# Patient Record
Sex: Female | Born: 1955 | Marital: Single
Health system: Southern US, Community
[De-identification: ages and names within clinical notes are randomized; demographics above are authoritative.]

---

## 2014-01-15 ENCOUNTER — Ambulatory Visit
Admit: 2014-01-15 | Disposition: A | Discharge status: Home / Self Care | Payer: Self-pay | Attending: Nurse Practitioner | Admitting: Nurse Practitioner

## 2014-04-14 ENCOUNTER — Inpatient Hospital Stay: Payer: Self-pay | Admitting: Family Medicine

## 2014-04-14 LAB — URINALYSIS, COMPLETE
BILIRUBIN, UR: NEGATIVE
GLUCOSE, UR: NEGATIVE mg/dL (ref 0–75)
KETONE: NEGATIVE
Nitrite: NEGATIVE
Ph: 6 (ref 4.5–8.0)
Protein: NEGATIVE
RBC,UR: 3 /HPF (ref 0–5)
Specific Gravity: 1.013 (ref 1.003–1.030)
WBC UR: 12 /HPF (ref 0–5)

## 2014-04-14 LAB — APTT
ACTIVATED PTT: 34.9 s (ref 23.6–35.9)
ACTIVATED PTT: 68.2 s — AB (ref 23.6–35.9)

## 2014-04-14 LAB — CBC WITH DIFFERENTIAL/PLATELET
BASOS ABS: 0 10*3/uL (ref 0.0–0.1)
Basophil %: 0.1 %
Eosinophil #: 0 10*3/uL (ref 0.0–0.7)
Eosinophil %: 0 %
HCT: 31.9 % — ABNORMAL LOW (ref 35.0–47.0)
HGB: 10 g/dL — ABNORMAL LOW (ref 12.0–16.0)
Lymphocyte #: 0.5 10*3/uL — ABNORMAL LOW (ref 1.0–3.6)
Lymphocyte %: 3.1 %
MCH: 25.6 pg — ABNORMAL LOW (ref 26.0–34.0)
MCHC: 31.3 g/dL — ABNORMAL LOW (ref 32.0–36.0)
MCV: 82 fL (ref 80–100)
MONO ABS: 0.6 x10 3/mm (ref 0.2–0.9)
Monocyte %: 3.9 %
Neutrophil #: 13.4 10*3/uL — ABNORMAL HIGH (ref 1.4–6.5)
Neutrophil %: 92.9 %
PLATELETS: 527 10*3/uL — AB (ref 150–440)
RBC: 3.89 10*6/uL (ref 3.80–5.20)
RDW: 20.9 % — ABNORMAL HIGH (ref 11.5–14.5)
WBC: 14.5 10*3/uL — ABNORMAL HIGH (ref 3.6–11.0)

## 2014-04-14 LAB — PROTIME-INR
INR: 1
Prothrombin Time: 13.5 secs (ref 11.5–14.7)

## 2014-04-14 LAB — BASIC METABOLIC PANEL
Anion Gap: 8 (ref 7–16)
BUN: 20 mg/dL — AB (ref 7–18)
CREATININE: 0.8 mg/dL (ref 0.60–1.30)
Calcium, Total: 8.3 mg/dL — ABNORMAL LOW (ref 8.5–10.1)
Chloride: 93 mmol/L — ABNORMAL LOW (ref 98–107)
Co2: 27 mmol/L (ref 21–32)
EGFR (African American): >60
EGFR (Non-African Amer.): >60
GLUCOSE: 88 mg/dL (ref 65–99)
Osmolality: 259 (ref 275–301)
Potassium: 3.9 mmol/L (ref 3.5–5.1)
Sodium: 128 mmol/L — ABNORMAL LOW (ref 136–145)

## 2014-04-15 DIAGNOSIS — I059 Rheumatic mitral valve disease, unspecified: Secondary | ICD-10-CM

## 2014-04-15 LAB — BASIC METABOLIC PANEL
Anion Gap: 5 — ABNORMAL LOW (ref 7–16)
BUN: 17 mg/dL (ref 7–18)
CALCIUM: 7.6 mg/dL — AB (ref 8.5–10.1)
Chloride: 102 mmol/L (ref 98–107)
Co2: 26 mmol/L (ref 21–32)
Creatinine: 0.69 mg/dL (ref 0.60–1.30)
EGFR (African American): >60
EGFR (Non-African Amer.): >60
Glucose: 67 mg/dL (ref 65–99)
Osmolality: 266 (ref 275–301)
Potassium: 3.5 mmol/L (ref 3.5–5.1)
Sodium: 133 mmol/L — ABNORMAL LOW (ref 136–145)

## 2014-04-15 LAB — APTT
ACTIVATED PTT: 56.3 s — AB (ref 23.6–35.9)
Activated PTT: 36.1 secs — ABNORMAL HIGH (ref 23.6–35.9)
Activated PTT: 51.9 secs — ABNORMAL HIGH (ref 23.6–35.9)

## 2014-04-15 LAB — CBC WITH DIFFERENTIAL/PLATELET
BASOS ABS: 0 10*3/uL (ref 0.0–0.1)
Basophil %: 0.2 %
Eosinophil #: 0 10*3/uL (ref 0.0–0.7)
Eosinophil %: 0 %
HCT: 28.1 % — ABNORMAL LOW (ref 35.0–47.0)
HGB: 8.9 g/dL — AB (ref 12.0–16.0)
LYMPHS ABS: 1.1 10*3/uL (ref 1.0–3.6)
LYMPHS PCT: 10.4 %
MCH: 26.1 pg (ref 26.0–34.0)
MCHC: 31.6 g/dL — AB (ref 32.0–36.0)
MCV: 83 fL (ref 80–100)
MONOS PCT: 6.2 %
Monocyte #: 0.6 x10 3/mm (ref 0.2–0.9)
NEUTROS ABS: 8.6 10*3/uL — AB (ref 1.4–6.5)
Neutrophil %: 83.2 %
PLATELETS: 465 10*3/uL — AB (ref 150–440)
RBC: 3.41 10*6/uL — AB (ref 3.80–5.20)
RDW: 20.7 % — AB (ref 11.5–14.5)
WBC: 10.3 10*3/uL (ref 3.6–11.0)

## 2014-04-16 LAB — APTT
ACTIVATED PTT: 59 s — AB (ref 23.6–35.9)
Activated PTT: 80.7 secs — ABNORMAL HIGH (ref 23.6–35.9)

## 2014-04-16 LAB — CLOSTRIDIUM DIFFICILE(ARMC)

## 2014-04-17 ENCOUNTER — Ambulatory Visit: Payer: Self-pay | Admitting: Internal Medicine

## 2014-04-17 ENCOUNTER — Ambulatory Visit
Admit: 2014-04-17 | Disposition: A | Discharge status: Home / Self Care | Payer: Self-pay | Attending: Nurse Practitioner | Admitting: Nurse Practitioner

## 2014-04-17 LAB — CBC WITH DIFFERENTIAL/PLATELET
BASOS PCT: 0.5 %
Basophil #: 0 10*3/uL (ref 0.0–0.1)
EOS PCT: 0 %
Eosinophil #: 0 10*3/uL (ref 0.0–0.7)
HCT: 25.5 % — AB (ref 35.0–47.0)
HGB: 8.1 g/dL — AB (ref 12.0–16.0)
Lymphocyte #: 1 10*3/uL (ref 1.0–3.6)
Lymphocyte %: 11.1 %
MCH: 26.3 pg (ref 26.0–34.0)
MCHC: 31.9 g/dL — ABNORMAL LOW (ref 32.0–36.0)
MCV: 82 fL (ref 80–100)
MONO ABS: 0.6 x10 3/mm (ref 0.2–0.9)
MONOS PCT: 7 %
Neutrophil #: 7.5 10*3/uL — ABNORMAL HIGH (ref 1.4–6.5)
Neutrophil %: 81.4 %
Platelet: 448 10*3/uL — ABNORMAL HIGH (ref 150–440)
RBC: 3.09 10*6/uL — ABNORMAL LOW (ref 3.80–5.20)
RDW: 21 % — ABNORMAL HIGH (ref 11.5–14.5)
WBC: 9.3 10*3/uL (ref 3.6–11.0)

## 2014-04-17 LAB — BASIC METABOLIC PANEL
Anion Gap: 6 — ABNORMAL LOW (ref 7–16)
BUN: 13 mg/dL (ref 7–18)
CHLORIDE: 107 mmol/L (ref 98–107)
CO2: 24 mmol/L (ref 21–32)
Calcium, Total: 7.5 mg/dL — ABNORMAL LOW (ref 8.5–10.1)
Creatinine: 0.79 mg/dL (ref 0.60–1.30)
EGFR (African American): >60
Glucose: 82 mg/dL (ref 65–99)
OSMOLALITY: 273 (ref 275–301)
Potassium: 3.2 mmol/L — ABNORMAL LOW (ref 3.5–5.1)
Sodium: 137 mmol/L (ref 136–145)

## 2014-04-17 LAB — APTT
ACTIVATED PTT: 86 s — AB (ref 23.6–35.9)
Activated PTT: >160 secs (ref 23.6–35.9)

## 2014-04-18 LAB — BASIC METABOLIC PANEL
ANION GAP: 7 (ref 7–16)
BUN: 11 mg/dL (ref 7–18)
CALCIUM: 7.5 mg/dL — AB (ref 8.5–10.1)
CREATININE: 0.8 mg/dL (ref 0.60–1.30)
Chloride: 108 mmol/L — ABNORMAL HIGH (ref 98–107)
Co2: 23 mmol/L (ref 21–32)
EGFR (African American): >60
EGFR (Non-African Amer.): >60
GLUCOSE: 81 mg/dL (ref 65–99)
OSMOLALITY: 274 (ref 275–301)
Potassium: 3.9 mmol/L (ref 3.5–5.1)
SODIUM: 138 mmol/L (ref 136–145)

## 2014-04-18 LAB — URINE CULTURE

## 2014-04-20 LAB — HEMOGLOBIN: HGB: 8.3 g/dL — AB (ref 12.0–16.0)

## 2014-04-21 LAB — BASIC METABOLIC PANEL
Anion Gap: 8 (ref 7–16)
BUN: 15 mg/dL (ref 7–18)
CREATININE: 0.51 mg/dL — AB (ref 0.60–1.30)
Calcium, Total: 7.6 mg/dL — ABNORMAL LOW (ref 8.5–10.1)
Chloride: 109 mmol/L — ABNORMAL HIGH (ref 98–107)
Co2: 21 mmol/L (ref 21–32)
EGFR (African American): >60
Glucose: 84 mg/dL (ref 65–99)
Osmolality: 276 (ref 275–301)
Potassium: 4.2 mmol/L (ref 3.5–5.1)
Sodium: 138 mmol/L (ref 136–145)

## 2014-04-21 LAB — CBC WITH DIFFERENTIAL/PLATELET
BASOS ABS: 0 10*3/uL (ref 0.0–0.1)
BASOS PCT: 0.1 %
EOS ABS: 0 10*3/uL (ref 0.0–0.7)
Eosinophil %: 0.1 %
HCT: 21.2 % — AB (ref 35.0–47.0)
HGB: 6.6 g/dL — AB (ref 12.0–16.0)
LYMPHS ABS: 1.2 10*3/uL (ref 1.0–3.6)
Lymphocyte %: 10.2 %
MCH: 26.1 pg (ref 26.0–34.0)
MCHC: 31 g/dL — ABNORMAL LOW (ref 32.0–36.0)
MCV: 84 fL (ref 80–100)
MONOS PCT: 4.7 %
Monocyte #: 0.5 x10 3/mm (ref 0.2–0.9)
Neutrophil #: 9.6 10*3/uL — ABNORMAL HIGH (ref 1.4–6.5)
Neutrophil %: 84.9 %
Platelet: 426 10*3/uL (ref 150–440)
RBC: 2.52 10*6/uL — AB (ref 3.80–5.20)
RDW: 21.1 % — ABNORMAL HIGH (ref 11.5–14.5)
WBC: 11.3 10*3/uL — AB (ref 3.6–11.0)

## 2014-04-21 LAB — HEMOGLOBIN: HGB: 9.7 g/dL — AB (ref 12.0–16.0)

## 2014-04-22 LAB — HEMOGLOBIN
HGB: 8.5 g/dL — AB (ref 12.0–16.0)
HGB: 9.5 g/dL — AB (ref 12.0–16.0)

## 2014-04-22 LAB — ALBUMIN: ALBUMIN: 1 g/dL — AB (ref 3.4–5.0)

## 2014-04-23 LAB — HEMOGLOBIN: HGB: 7.9 g/dL — ABNORMAL LOW (ref 12.0–16.0)

## 2014-04-24 LAB — HEMOGLOBIN: HGB: 7.4 g/dL — ABNORMAL LOW (ref 12.0–16.0)

## 2014-05-17 ENCOUNTER — Ambulatory Visit: Payer: Self-pay | Admitting: Internal Medicine

## 2014-06-17 DEATH — deceased

## 2015-03-01 IMAGING — CR DG FEMUR 2V*R*
1 series · 4 of 4 positions shown · non-contrast
Comparison: None.

CLINICAL DATA: Trauma and pain.

EXAM:
RIGHT KNEE - COMPLETE 4+ VIEW; RIGHT FEMUR - 2 VIEW

[Series 1: t femur proximal ap right · 0.14mm/px · 4 of 4 slices shown]
[im 1/4]
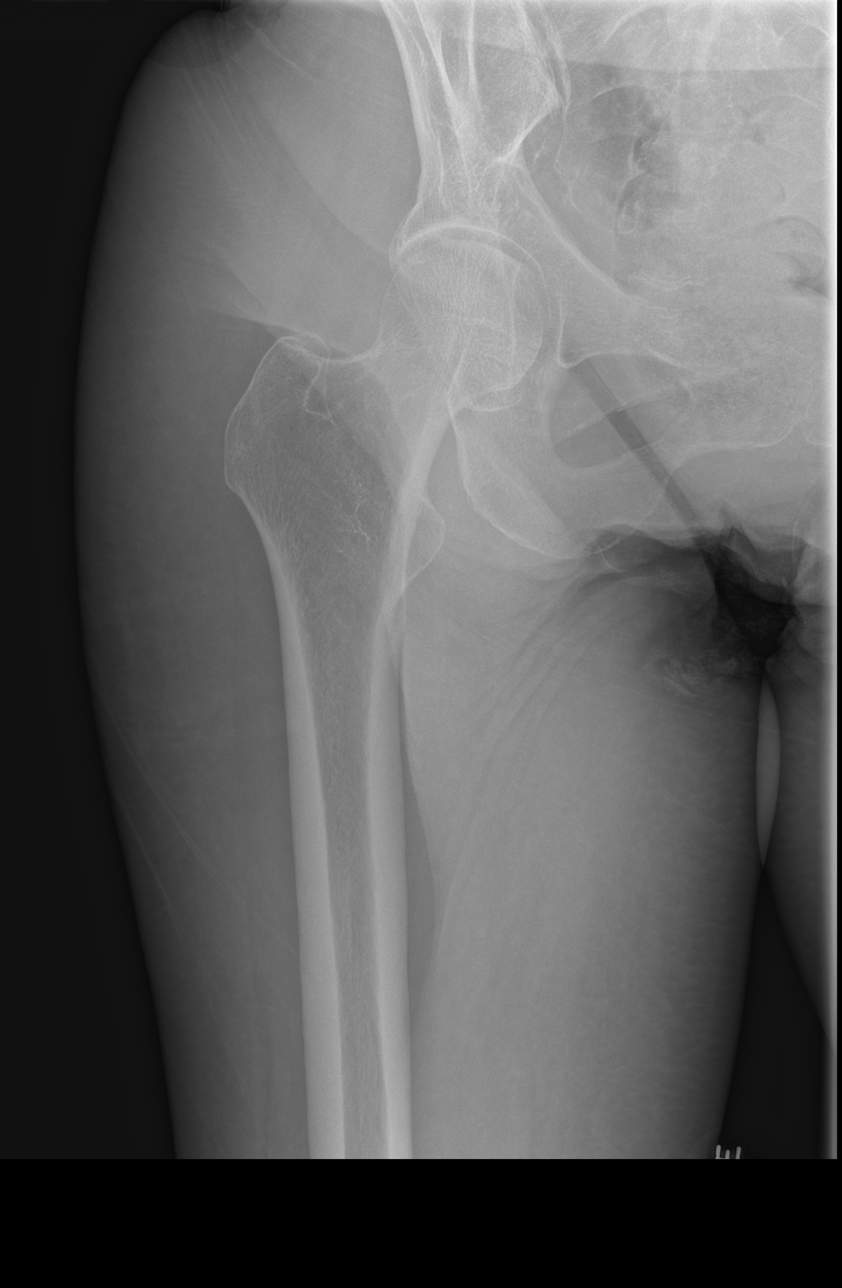
[im 2/4]
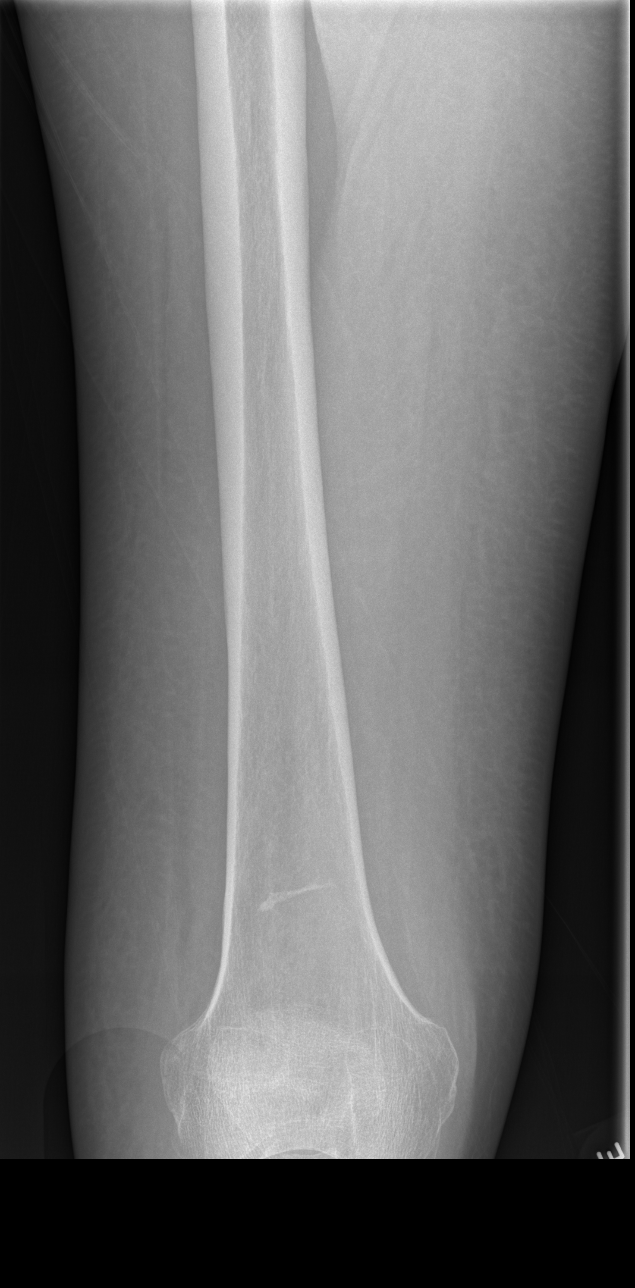
[im 3/4]
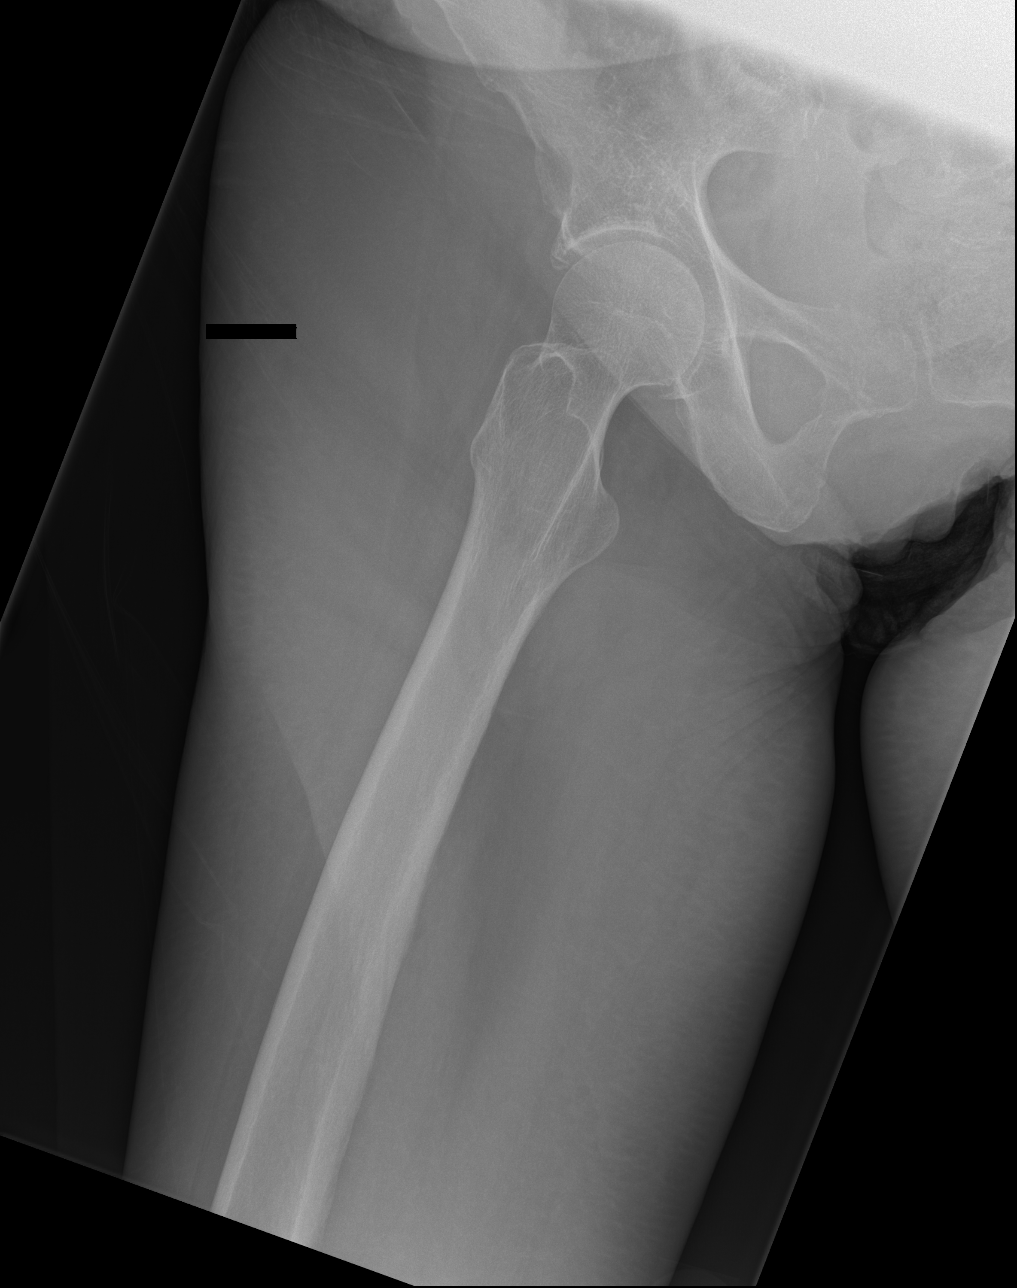
[im 4/4]
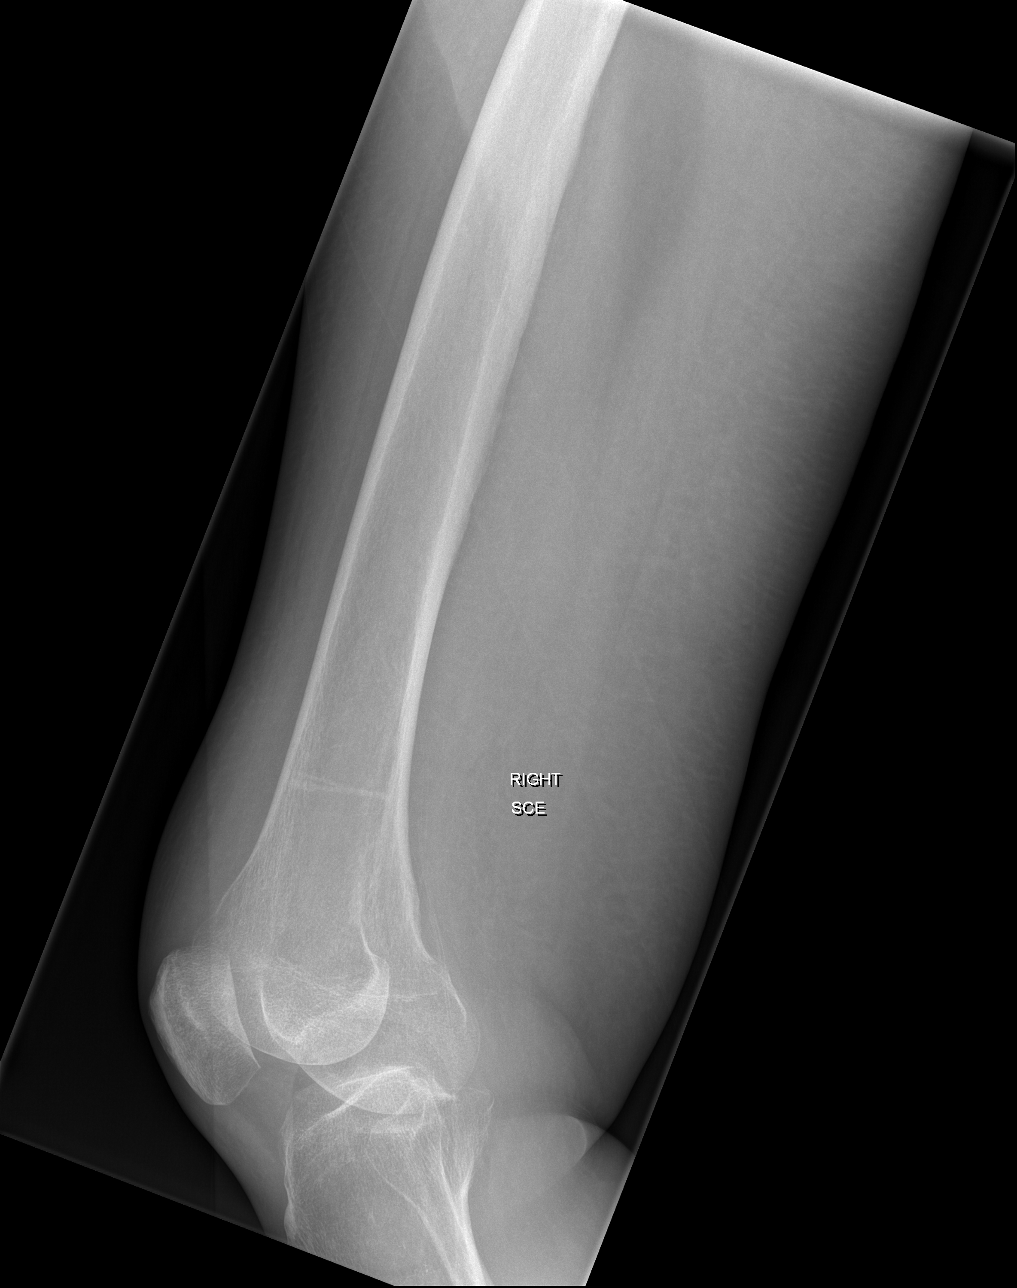

[4 of 4 positions shown; findings below may reference images not displayed]

FINDINGS: Four views of the right knee demonstrate mild osteopenia. Mild
patellofemoral and medial compartment joint space narrowing. No
acute fracture or dislocation. No joint effusion.

Four views of the right femur.  No acute fracture or dislocation.
IMPRESSION: No acute findings about the right femur or knee.

Mild 2 compartment knee osteoarthritis.

## 2015-03-01 IMAGING — CT CT ANGIO CHEST-ABD-PELV
2 of 6 series · 12 of 36 positions shown, 14 images · IV contrast (APPLIED)
Comparison: Right lower extremity ultrasound 1303 hr the same day.

CLINICAL DATA: 58-year-old female with right lower extremity
weakness pain and swelling. Shortness of breath, hypotension.
Dissection or pulmonary embolus. Initial encounter. History of
metastatic colon cancer. Right lower extremity DVT detected on
ultrasound today.

EXAM:
CT ANGIOGRAPHY CHEST, ABDOMEN AND PELVIS
TECHNIQUE: Multidetector CT imaging through the chest, abdomen and pelvis was
performed using the standard protocol during bolus administration of
intravenous contrast. Multiplanar reconstructed images and MIPs were
obtained and reviewed to evaluate the vascular anatomy.
CONTRAST:  100 mL Isovue 370.

[Series 6: arterial · axial · arterial · 0.72mm/px · z∈[-848,-284]mm · 11 of 326 slices shown, 13 images]
[im 29/326  mediastinal]
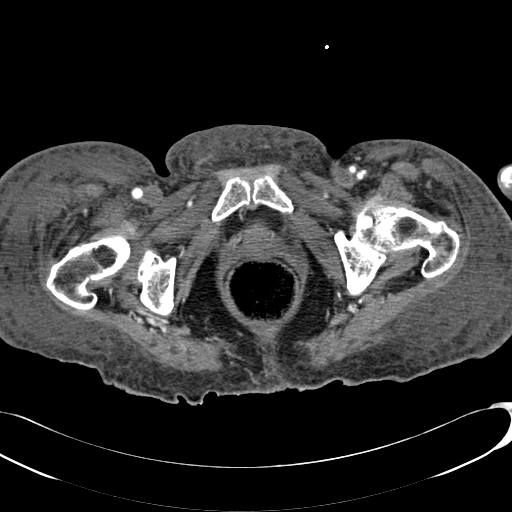
[im 29/326  bone]
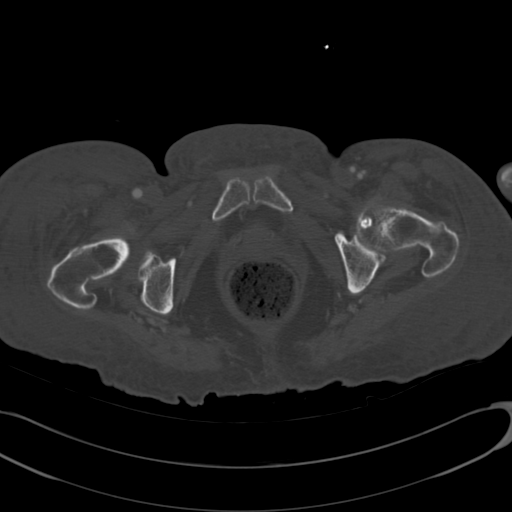
[im 71/326  mediastinal]
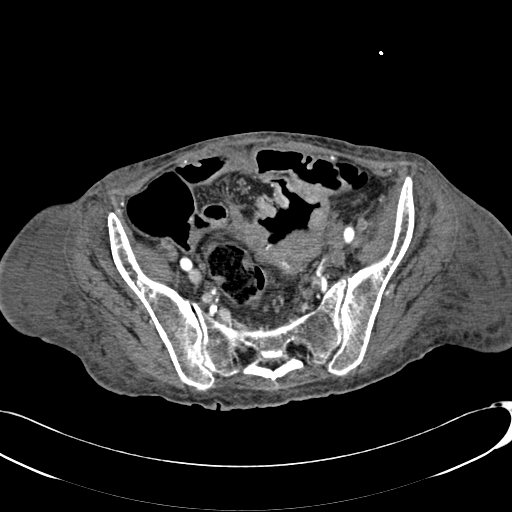
[im 99/326  mediastinal]
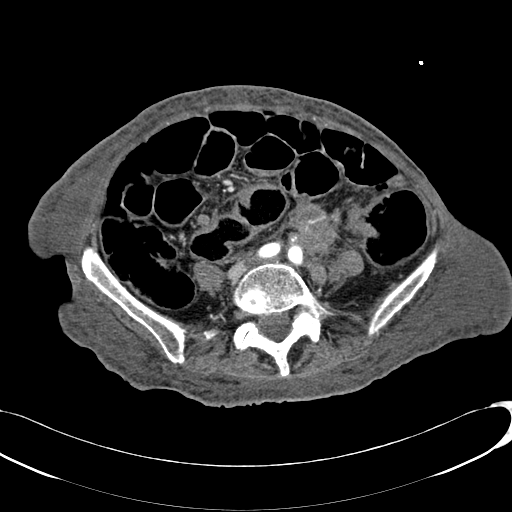
[im 142/326  mediastinal]
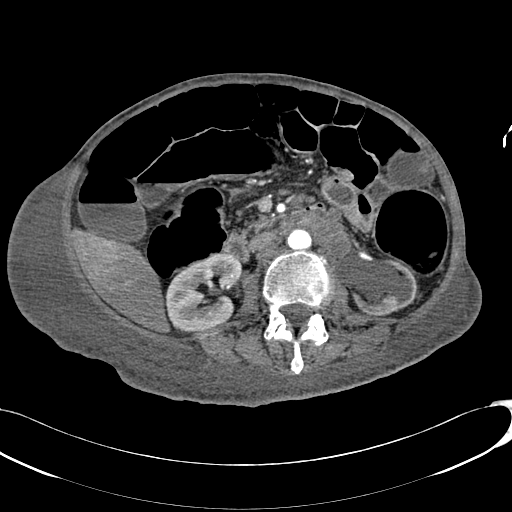
[im 184/326  mediastinal]
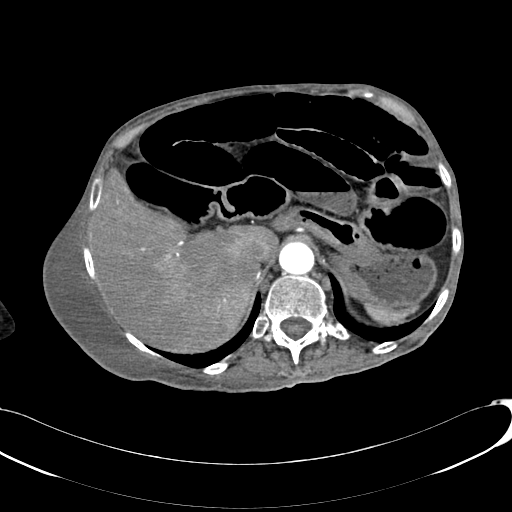
[im 227/326  mediastinal]
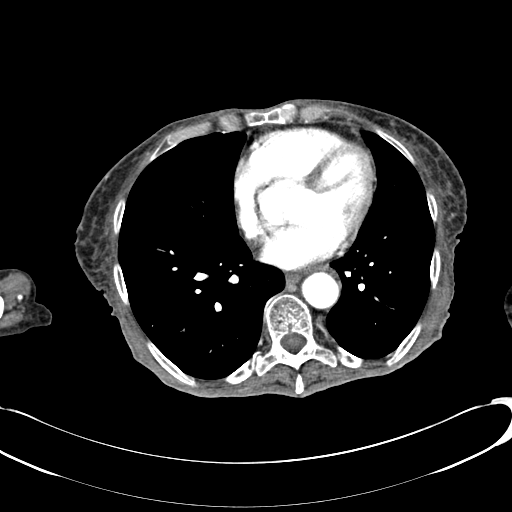
[im 255/326  mediastinal]
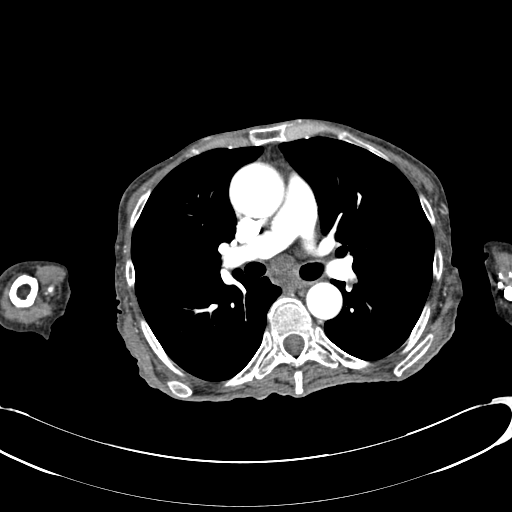
[im 269/326  lung]
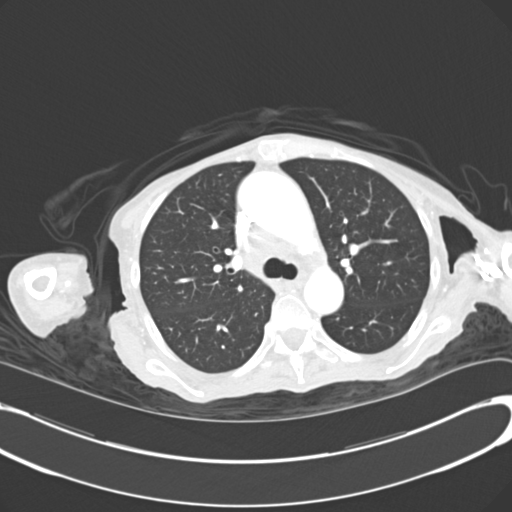
[im 283/326  lung]
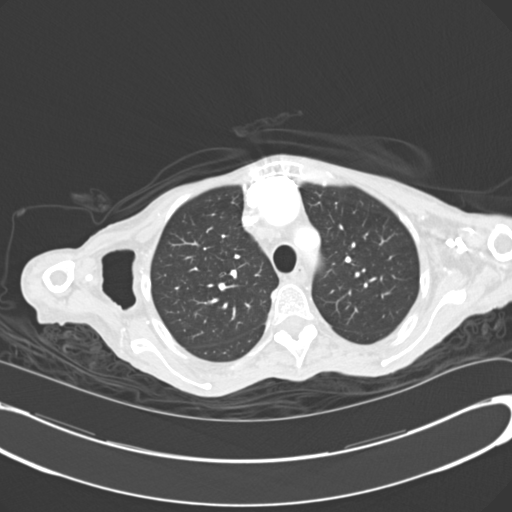
[im 297/326  mediastinal]
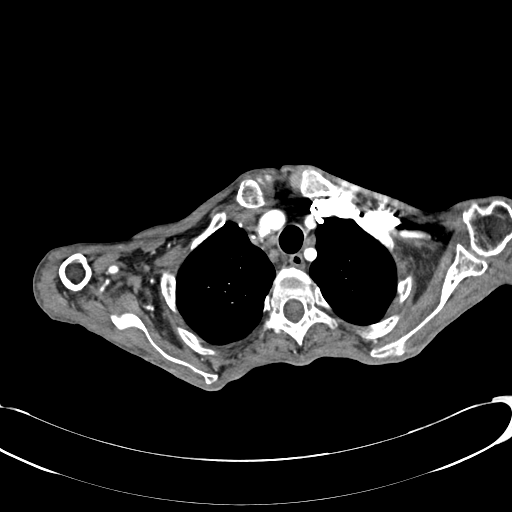
[im 297/326  lung]
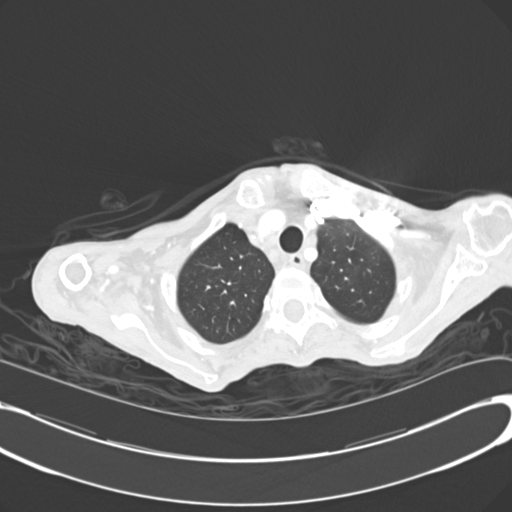
[im 311/326  lung]
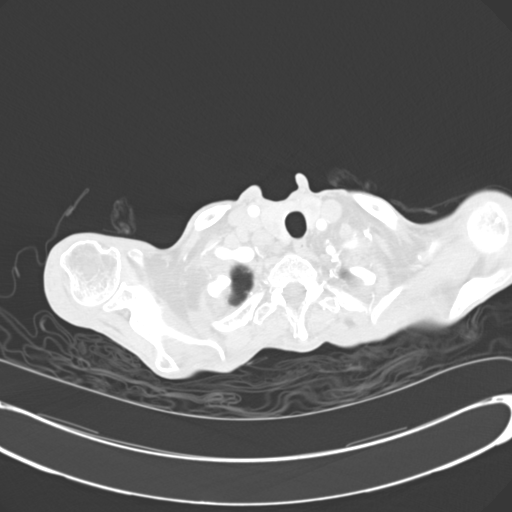

[Series 8: cor arterial mpr · coronal · arterial · 0.68mm/px · 1 of 119 slices shown]
[im 60/119  mediastinal]
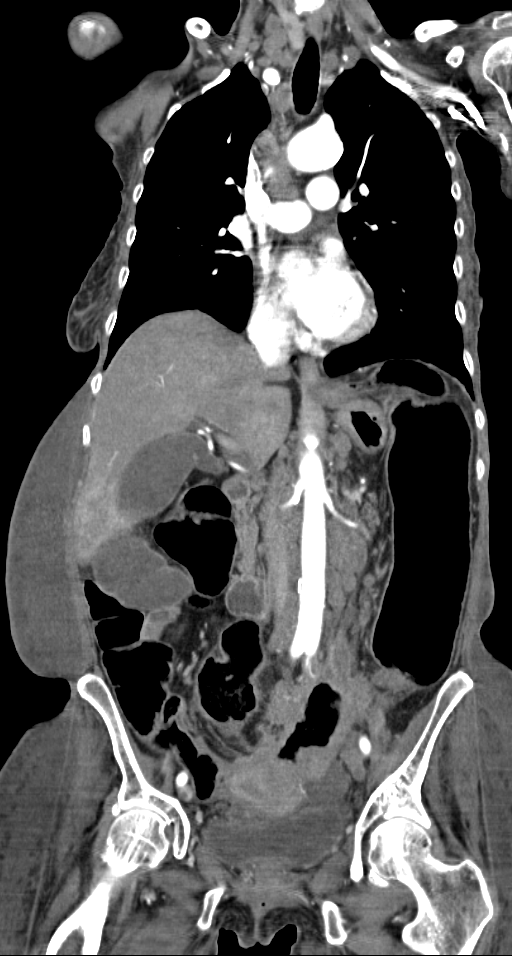

[12 of 36 positions shown; findings below may reference images not displayed]

FINDINGS: CTA CHEST FINDINGS

Major airways are patent. Occasional mild linear scarring or
atelectasis in both lungs. No pneumothorax, pleural effusion or
consolidation. There is a small cluster of 3 mm pulmonary nodules in
the periphery of the right midlung (series 5 images 20 and 21).
Similar cluster seen in the right upper lobe on image 12. No
pulmonary nodule identified on the left.

No destructive osseous lesion identified in the chest.

Multiple bilateral thyroid nodules, the largest about 12 mm,
significance doubtful in this clinical setting.

Bulky right peritracheal and right thoracic inlet lymphadenopathy,
individual nodes or nodal conglomeration measuring up to 17 mm short
axis. Bulky right anterior carina nodal conglomeration encompassing
26 x 31 mm. Confluent nodal disease continues into the subcarinal
station. No hilar lymphadenopathy. Smaller left thoracic inlet/
prevascular nodes measuring up to 11 mm short axis individually. No
pericardial effusion. No axillary lymphadenopathy.

VASCULAR FINDINGS:

Mild atherosclerosis of the aorta and proximal great vessels which
remain patent. No thoracic aortic dissection. Ascending aorta
fusiform enlargement up to 40 mm diameter.

Positive right lower lobe pulmonary emboli (series 6, image 90).
More distal occlusive emboli in the posterior basal segment of the
right lower lobe. Right lateral basal segment involvement also
suspected. Right upper lobe pulmonary arteries are spared. No saddle
or central embolus. Small volume clot at the left lower lobe
segmental pulmonary artery branching (image 72). Left upper lobe
pulmonary arteries are spared.

RV/LV ratio 0.95. Mild straightening of the interventricular septum
(series 6, image 107).

Review of the MIP images confirms the above findings.

CTA ABDOMEN AND PELVIS FINDINGS

No destructive osseous lesion identified in the lumbar spine or
pelvis.

Moderate to severe body wall edema greater on the right. Diffuse
subcutaneous stranding. This has an appearance suggesting anasarca,
but is asymmetrically involving the right abdominal wall and flank
to a greater degree.

No pelvic free fluid. Stool in the rectum. In the sigmoid colon
there is a thick walled fungating soft tissue mass nearly
obliterating the lumen of the proximal sigmoid. This lesion
encompasses 53 by 139 x 47 mm (AP by transverse by CC). See coronal
image 56 and series 6, image 264. The lesion is inseparable from the
bladder fundus. The lesion is inseparable from distal small bowel
loops, and more proximal small bowel loops in the abdomen are mildly
to moderately dilated up to the 35 mm diameter. There is low-density
stool throughout the more proximal colon which also is mildly
dilated. There is bulky retroperitoneal lymphadenopathy extending to
the SMA origin. There is small bowel mesenteric adenopathy with
nodes individually measuring up to 23 mm short axis. The left ureter
and kidney are obstructed by the retroperitoneal and sigmoid
disease. The left renal cortex is severely thinned.

The stomach and duodenum are decompressed. There is heterogeneous
enhancement of the liver, with geographic hypoenhancement. There are
small superimposed more discrete low-density lesions in the liver
which are small, the largest is 2 cm in the inferior right hepatic
lobe (series 6, image 167).

Negative gallbladder, spleen, pancreas (atrophied), adrenal glands,
right kidney, and right ureter.

VASCULAR FINDINGS:

The abdominal aorta and its major branches are patent. Major pelvic
arterial structures are patent with mild atherosclerosis. There does
appear to be preserved enhancement of the right pelvic veins and
IVC. The IVC is flattened in the upper abdomen, perhaps in part due
to the retroperitoneal metastases. No abdominal aortic dissection.

Review of the MIP images confirms the above findings.
IMPRESSION: 1. Positive for acute pulmonary embolus. Positive for acute PE with
CTevidence of right heart strain (RV/LV Ratio = 0.95) consistent
with at least submassive (intermediate risk) PE. The presence of
right heart strain has been associated with an increased risk of
morbidity and mortality. Critical Value/emergent results were called
by telephone at the time of interpretation on 04/14/2014 at [DATE]
to Dr. QUIRIJN AMAZIGH , who verbally acknowledged these results.
2. Bulky and fungating sigmoid colon tumor with secondary
involvement of distal small bowel loops, the left ureter, and the
dome of the bladder.
3. Subsequent small bowel obstruction, appears moderate. Subsequent
severe and subacute to chronic appearing left renal an ureter
obstruction (with left renal atrophy).
4. Bulky mediastinal, retroperitoneal, and thoracic inlet metastatic
nodal disease.
5. Abnormal enhancement of the liver, might in part reflect
congestive hepatopathy. Several 2 cm or smaller hypodense liver
lesions, indeterminate for liver metastases.
6. No abdominal aortic dissection or aneurysm. There is fusiform
aneurysmal enlargement of the ascending aorta up to 40 mm diameter.

## 2015-03-10 NOTE — Discharge Summary (Signed)
PATIENT NAME:  French, [name] MR#:  [id number] DATE OF BIRTH:  December 02, 1955  DATE OF ADMISSION:  04/14/2014 DATE OF DISCHARGE:  04/20/2014  ADMITTING DIAGNOSIS: Right leg pain.   DISCHARGE DIAGNOSES: 1.  Right leg pain due to deep vein thrombosis, as well as pulmonary embolism.  2.  Metastatic colon cancer with widespread metastatic disease, inoperable and prognosis very poor.  3.  Anemia. 4.  Hypotension with chronic borderline hypotension possibly related to pulmonary embolism but now blood pressure stable. 5.  Possible urinary tract infection on presentation status post treatment. 6.  Diarrhea with negative Clostridium difficile likely due to colon cancer.  CONSULTANTS:  Dr. Meredeth IdeFleming. Palliative care Borders, Ashley French.  PERTINENT LABORATORY EVALUATIONS:  Admitting glucose 88, BUN 20, creatinine 0.80. Sodium 128, potassium was 3.9, chloride 93, CO2 of 27, calcium 8.3. WBC 14.5, hemoglobin 10, platelet count was 527. INR 1.0. Urinalysis was nitrites negative.  C. diff was negative.  Echocardiogram of the heart showed EF 45% to 50%.  CT per PE protocol showed positive for acute PE with right heart strain.  Bulky mediastinal, retroperitoneal and thoracic inlet metastatic nodal disease.  HOSPITAL COURSE: Please refer to H and P done by the admitting physician. The patient is a 59 year old African American female with past medical history of metastatic colon cancer diagnosed about 3 months ago at [facility] who was discharged home with hospice because her metastatic disease is inoperable, and was told that there was not much else treatment available. The patient presented to our hospital with lower extremity swelling. She was evaluated in the ED and was diagnosed with a DVT. Also she underwent a CT per PE protocol which was positive for PE. The patient was started initially on heparin. She was thought to have right ventricular stain as well on a CT scan. She was seen in consultation by  pulmonary who agreed with the current plan. The patient was switched over subsequently to Xarelto and is currently being treated with Xarelto.  Currently, she is stable, not short of breath. Blood pressure is stable. Her prognosis is poor, and a very high, short-term mortality. At this time, she is stable for discharge.   DISCHARGE MEDICATIONS: Lactulose 30 mg daily, oxycodone 5 mg 1 tab q. 2 to 3 hours as needed, morphine 30 mg tablet 1 tab p.o. b.i.d., haloperidol 0.5 four times a day as needed, nystatin 5 mL 4 times a day swish and swallow, senna 2 tabs at bedtime, dexamethasone 4 mg 1 tab p.o. b.i.d., Xarelto 15 mg 1 tab p.o. b.i.d. for 20 days then Xarelto 20 mg 1 tab p.o. daily after the 15 mg.   ACTIVITY: As tolerated.   FOLLOWUP: With primary MD in 1 to 2 weeks. Follow with primary oncologist in 2 to 4 weeks.  TIME SPENT ON THE DISCHARGE:  35 minutes.   ____________________________ Lacie ScottsShreyang H. Allena KatzPatel, MD shp:ce D: 04/20/2014 14:59:31 ET T: 04/20/2014 15:36:02 ET JOB#: [id number]  cc: Aaminah Forrester H. Allena KatzPatel, MD, <Dictator> Charise CarwinSHREYANG H Oleta Gunnoe MD ELECTRONICALLY SIGNED 04/22/2014 14:34

## 2015-03-10 NOTE — Op Note (Signed)
PATIENT NAME:  French, [name] MR#:  [id number] DATE OF BIRTH:  October 28, 1956  DATE OF PROCEDURE:  04/21/2014  PREOPERATIVE DIAGNOSES:  1.  Pulmonary embolus with gastrointestinal bleed. 2.  Metastatic colon cancer.   POSTOPERATIVE DIAGNOSES: 1.  Pulmonary embolus with gastrointestinal bleed. 2.  Metastatic colon cancer.   PROCEDURES PERFORMED: 1.  Ultrasound guidance for vascular access to right femoral.  2.  Catheter placement into inferior vena cava.  3.  Inferior venacavogram.  4.  Placement of a Bard Denali IVC filter.   SURGEON: Festus BarrenJason Charlisa Cham, MD    ANESTHESIA: Local with sedation.    ESTIMATED BLOOD LOSS: Minimal.   FLUOROSCOPY TIME:  Less than 1 minute.   CONTRAST USED:  Visipaque 15 mL.    INDICATION FOR PROCEDURE:  This is a 59 year old African American female with metastatic colon cancer. She has a recent large pulmonary embolus. She was started on Zaroxolyn, but now has developed GI bleeding and this will have to be stopped. She desired to have an IVC filter placed. We discussed that with her advanced malignancy, it would be reasonable to perform no procedure and have only comfort measures, but she was not prepared for this and desired to have a filter so no further PE would occur.   DESCRIPTION OF PROCEDURE:  The patient was brought to the vascular suite. The skin is sterilely prepped and draped and a sterile surgical field was created. The right femoral vein, after the left femoral vein was seen to be occluded, was accessed under direct ultrasound guidance without difficulty with a Seldinger needle and a J-wire was then placed. After skin nick and dilatation, the delivery sheath was placed into the inferior vena cava and an inferior venacavogram was performed. This demonstrated a patent IVC with the level of the renal veins at L1. There was a small amount of non-occlusive thrombus within the mid inferior vena cava just below the renal veins.  The filter was then deployed into the  inferior vena cava at the level of L2 just below the non-occlusive thrombus as there was not adequately a place to place this more proximally.  The delivery sheath was then removed. Pressure was held. Sterile dressing was placed. The patient tolerated the procedure well and was taken to the recovery room in stable condition.    ____________________________ Ashley NeedyJason S. Katori Wirsing, MD jsd:aw D: 04/21/2014 08:39:11 ET T: 04/21/2014 08:57:17 ET JOB#: [id number]  cc: Ashley NeedyJason S. Channel Papandrea, MD, <Dictator> Ashley NeedyJASON S Riniyah Speich MD ELECTRONICALLY SIGNED 04/28/2014 12:34

## 2015-03-10 NOTE — Discharge Summary (Signed)
Dates of Admission and Diagnosis:  Date of Admission 14-Apr-2014   Date of Discharge 24-Apr-2014   Admitting Diagnosis Pulmonary embolism   Final Diagnosis 1. Bilateral PE and RLE DVT 2. Metastatic colon cancer 3. GI bleed 4. Acute blood loss anemia 5. Severe malnutrition 6. Hypoalbuminemia 7. Deconditioning 8. Depression    Chief Complaint/History of Present Illness CHIEF COMPLAINT: Right leg pain.   HISTORY OF PRESENT ILLNESS: Ashley French is a 58-year-old African American female with past medical history significant for metastatic cancer diagnosed about 3 months ago at [facility], likely colon cancer. Was discharged home with hospice because it was inoperable and she was given an extremely poor prognosis. The patient states she was still walking at home but over the last couple of months noted that she has been having some right thigh and knee pain.  She still was ambulating with rehab. For over the last couple of weeks, her pain and swelling have become much worse to the point that she is not able to get out of bed and her daughter is not able to pick her up, so presented to the ER. She is followed by [facility] according to the patient. The patient was noted to be hypotensive and tachycardic here and her ultrasound showed occlusive right femoropopliteal DVT.  She also complained of some dyspnea going on since around the same time. No chest pain and CT chest was ordered which showed the patient has acute pulmonary embolus.  So she is being admitted for symptomatic DVT and PE.   PAST MEDICAL HISTORY:  Metastatic colon cancer with widespread metastatic disease, inoperable and poor prognosis.   Allergies:  No Known Allergies:     Hepatic:  06-Jun-15 05:38   Albumin, Serum  1.0 (Result(s) reported on 22 Apr 2014 at 12:38PM.)  Routine BB:  05-Jun-15 06:59   ABO Group + Rh Type A Positive  Antibody Screen NEGATIVE (Result(s) reported on 21 Apr 2014 at 08:12AM.)   Crossmatch Unit 1 Transfused  Crossmatch Unit 2 Transfused  Result(s) reported on 22 Apr 2014 at 07:25AM.  Routine Chem:  05-Jun-15 04:10   Glucose, Serum 84  BUN 15  Creatinine (comp)  0.51  Sodium, Serum 138  Potassium, Serum 4.2  Chloride, Serum  109  CO2, Serum 21  Calcium (Total), Serum  7.6  Anion Gap 8  Osmolality (calc) 276  eGFR (African American) >60  eGFR (Non-African American) >60 (eGFR values <60mL/min/1.73 m2 may be an indication of chronic kidney disease (CKD). Calculated eGFR is useful in patients with stable renal function. The eGFR calculation will not be reliable in acutely ill patients when serum creatinine is changing rapidly. It is not useful in  patients on dialysis. The eGFR calculation may not be applicable to patients at the low and high extremes of body sizes, pregnant women, and vegetarians.)  Routine Hem:  05-Jun-15 04:10   Hemoglobin (CBC)  6.6  WBC (CBC)  11.3  RBC (CBC)  2.52  Hematocrit (CBC)  21.2  Platelet Count (CBC) 426  MCV 84  MCH 26.1  MCHC  31.0  RDW  21.1  Neutrophil % 84.9  Lymphocyte % 10.2  Monocyte % 4.7  Eosinophil % 0.1  Basophil % 0.1  Neutrophil #  9.6  Lymphocyte # 1.2  Monocyte # 0.5  Eosinophil # 0.0  Basophil # 0.0 (Result(s) reported on 21 Apr 2014 at 05:44AM.)    21:04   Hemoglobin (CBC)  9.7 (Result(s) reported on 21 Apr 2014 at   09:39PM.)  06-Jun-15 05:38   Hemoglobin (CBC)  8.5 (Result(s) reported on 22 Apr 2014 at 06:14AM.)    13:16   Hemoglobin (CBC)  9.5 (Result(s) reported on 22 Apr 2014 at 01:43PM.)  07-Jun-15 06:07   Hemoglobin (CBC)  7.9 (Result(s) reported on 23 Apr 2014 at 06:50AM.)   Pertinent Past History:  Pertinent Past History Metastatic colon cancer AOCD   Hospital Course:  Hospital Course * Bilateal PE and RLE DVT with RV strain Pt was on Xarelto but had GI bleed and had to be stopped. Now has IVC filter placed. High risk for progression of PE/DVT due to active cancer  * GI  bleed likely from the colon cancer with Acute blood loss anemia over AOCD. Transfused 2 Units PRBC 04/21/2014. GI has seen pt but she is not a candidate for endoscopy. Bleeding has stopped as pt is of Xarelto. She will have likely small amounts of bleeding going on due to her cancer.  * Hypovolemic hypotension due to blood loss- Resolved  * hypokalemia- replaced  * Metastatic colon cancer- On Hospice .prognosis poor . No plans of treatment  * Diarrhea- negative C diff, loperamide PRN.  Very poor prognosis with Extensive colon cancer and bilateral PE.  Pt presently wants to continue to work with PT to get strength back and hopefully go home with hospice. She does want a quality life rather than prolong life with aggressive means without quality. May need PRN blood transfusions  She needs f/u with palliative care after discharge  Time spent on discharge 40 min   Condition on Discharge Guarded   Code Status:  Code Status No Code/Do Not Resuscitate   PHYSICAL EXAM ON DISCHARGE:  Physical Exam:  HEENT pale conjunctivae, hearing intact to voice   NECK supple   RESP normal resp effort   ABD denies tenderness   EXTR positive edema   DISCHARGE INSTRUCTIONS HOME MEDS:  Medication Reconciliation: Patient's Home Medications at Discharge:     Medication Instructions  lactulose 10 g/15 ml oral syrup  30 milliliter(s) orally once a day   morphine 30 mg oral tablet  1 tab(s) orally 2 times a day   nystatin 100000 units/ml oral suspension  5 milliliter(s) orally 4 times a day   senna s 50 mg-8.6 mg oral tablet  2 tab(s) orally once a day (at bedtime)   dexamethasone 4 mg oral tablet  1 tab(s) orally 2 times a day   haloperidol 0.5 mg oral tablet  1 tab(s) orally 4 times a day, As Needed - for Agitation   oxycodone 5 mg oral tablet  2 tab(s) orally every 4 hours, As Needed   morphine 10 mg/24 hr oral capsule, extended release  1 cap(s) orally 2 times a day   ativan 0.5 mg oral  tablet  1 tab(s) orally 4 times a day, As Needed - for Anxiety, Nervousness   ferrous sulfate 325 mg (65 mg elemental iron) oral tablet  1 tab(s) orally 3 times a day (with meals)    STOP TAKING THE FOLLOWING MEDICATION(S):    ranitidine 150 mg oral capsule: 1 cap(s) orally 2 times a day  Physician's Instructions:  Diet Regular   Activity Limitations As tolerated   Referrals pt eval and treat   Return to Work Not Applicable   Time frame for Follow Up Appointment 1-2 weeks  Palliative care   Time frame for Follow Up Appointment 1-2 days  Physician at rehab    Electronic Signatures: Sudini, Srikar   Reece Levy (MD)  (Signed 08-Jun-15 15:39)  Authored: ADMISSION DATE AND DIAGNOSIS, CHIEF COMPLAINT/HPI, Allergies, PERTINENT LABS, PERTINENT PAST HISTORY, HOSPITAL COURSE, PHYSICAL EXAM ON DISCHARGE, DISCHARGE INSTRUCTIONS HOME MEDS, PATIENT INSTRUCTIONS   Last Updated: 08-Jun-15 15:39 by Alba Destine (MD)

## 2015-03-10 NOTE — Consult Note (Signed)
CHIEF COMPLAINT and HISTORY:  Subjective/Chief Complaint PE, gi bleed   History of Present Illness Patient is a 59 yo AAF with metastatic colon cancer with hospice services admitted with worsening FTT.  Has BLE edema.  Found to have PE.  Started on Xarelto and developed black stools and blood loss per rectum.  We are asked to place a filter.   PAST MEDICAL/SURGICAL HISTORY:  Past Medical History:   Cancer, Colon or Rectal:   ALLERGIES:  Allergies:  No Known Allergies:   HOME MEDICATIONS:  Home Medications: Medication Instructions Status  rivaroxaban 20 mg oral tablet 1 tab(s) orally once a day x 30 days Active  rivaroxaban 15 mg oral tablet 1 tab(s) orally 2 times a day (with meals) x 20 days Active  lactulose 10 g/15 mL oral syrup 30 milliliter(s) orally once a day Active  oxyCODONE 5 mg oral tablet tab(s) orally every 2 to 3 hours, As Needed Active  morphine 30 mg oral tablet 1 tab(s) orally 2 times a day Active  haloperidol 0.5 mg oral tablet 1 tab(s) orally 4 times a day, As Needed Active  nystatin 100000 units/mL oral suspension 5 milliliter(s) orally 4 times a day Active  Senna S 50 mg-8.6 mg oral tablet 2 tab(s) orally once a day (at bedtime) Active  dexamethasone 4 mg oral tablet 1 tab(s) orally 2 times a day Active   Family and Social History:  Family History Non-Contributory   Social History negative tobacco, negative ETOH   Place of Living Home   Review of Systems:  Fever/Chills No   Cough Yes   Sputum No   Abdominal Pain Yes   Diarrhea Yes   Constipation No   Nausea/Vomiting Yes   SOB/DOE Yes   Chest Pain Yes   Telemetry Reviewed NSR   Dysuria No   Tolerating PT Yes   Tolerating Diet Yes   Medications/Allergies Reviewed Medications/Allergies reviewed   Physical Exam:  GEN cachectic, thin   HEENT pink conjunctivae, dry oral mucosa   NECK No masses  trachea midline   RESP normal resp effort  no use of accessory muscles   CARD  regular rate  no JVD   VASCULAR ACCESS none   ABD positive tenderness  distended   GU no superpubic tenderness   LYMPH positive neck, positive axillae   EXTR positive edema, 3+   SKIN normal to palpation, No ulcers   NEURO cranial nerves intact, motor/sensory function intact   PSYCH alert, A+O to time, place, person   LABS:  Laboratory Results: LabObservation:    29-May-15 16:54, [place] Color Flow Doppler Lower Extrem Right (Leg)  OBSERVATION   Reason for Test R leg swelling r/o DVT    30-May-15 08:02, Echo Doppler  OBSERVATION   Reason for Test  Routine Micro:    31-May-15 00:11, Urine Culture  Organism Name   GRAM POSITIVE COCCI  Organism Quantity   4000 CFU/ML  Micro Text Report   URINE CULTURE    ORGANISM 1                4000 CFU/ML GRAM POSITIVE COCCI     ANTIBIOTIC  Specimen Source   CLEAN CATCH  Organism 1   4000 CFU/ML GRAM POSITIVE COCCI   Result(s) reported on 18 Apr 2014 at 02:55PM.    31-May-15 14:03, Clostridium Difficile  Micro Text Report   CLOSTRIDIUM DIFFICILE    C.DIFFICILE ANTIGEN       C.DIFFICILE GDH ANTIGEN : NEGATIVE  C.DIFFICILE TOXIN A/B     C.DIFFICILE TOXINS A AND B : NEGATIVE    INTERPRETATION            Negative for C. difficile.      ANTIBIOTIC  Cardiology:    30-May-15 08:02, Echo Doppler  Echo Doppler   REASON FOR EXAM:      COMMENTS:       PROCEDURE: [facility] - ECHO DOPPLER COMPLETE(TRANSTHOR)  - Apr 15 2014  8:02AM     RESULT: Echocardiogram Report    Patient Name:   [place] ANN Milewski Date of Exam: 04/15/2014  Medical Rec #:  [id number]             Custom1:  Date of Birth:  30-May-1956          Height:       70.0 in  Patient Age:    63 years           Weight:       138.0 lb  Patient Gender: F                  BSA:          1.78 m??    Indications: SOB  Sonographer:    Arville Go RDCS  Referring Phys:KALISETTI, RADHIKA    Sonographer Comments: Technically difficult study due to poor echo   windows.    Summary:   1.  Left ventricular ejection fraction, by visual estimation, is 45 to   50%.   2. Mildly decreased global left ventricular systolic function. The study   is not sufficient to evaluate wall motion.   3. Impaired relaxation pattern of LV diastolic filling.   4. Mild concentric left ventricular hypertrophy.   5. Mild mitral valve regurgitation.   6. Mild aortic valve sclerosis without stenosis.   7. Mild tricuspid regurgitation.   8. The ascending aorta is at the upper limit of normal.  2D AND M-MODE MEASUREMENTS (normal ranges within parentheses):  Left Ventricle:          Normal  IVSd (2D):      1.18 cm (0.7-1.1)  LVPWd (2D):     1.16 cm (0.7-1.1) Aorta/LA:                  Normal  LVIDd (2D):     4.23 cm (3.4-5.7) Aortic Root (2D): 3.30 cm (2.4-3.7)  LVIDs (2D):     3.18 cm           Left Atrium (2D): 3.10 cm (1.9-4.0)  LV FS (2D):     24.8 %   (>25%)  LV EF (2D):     49.5 %(>50%)                                    Right Ventricle:                                    RVd (2D):  SPECTRAL DOPPLER ANALYSIS (where applicable):  Aortic Valve: AoV Max Vel: 0.96 m/s AoV Peak PG: 3.7 mmHg AoV Mean PG:  LVOT Vmax: 0.78 m/s LVOT VTI:  LVOT Diameter: 2.50 cm  AoV Area, Vmax: 3.97 cm?? AoV Area, VTI:  AoV Area, Vmn:  Tricuspid Valve and PA/RV Systolic Pressure: TR Max Velocity: 2.06 m/s RA   Pressure: 5 mmHg RVSP/PASP: 22.0 mmHg  PHYSICIAN INTERPRETATION:  Left Ventricle: The left ventricular internal cavity size was normal.   Mild concentric left ventricular hypertrophy. Global LV systolic function   was mildly decreased. Left ventricular ejection fraction, by visual   estimation, is 45 to 50%. Spectral Doppler shows impaired relaxation   pattern of LV diastolic filling.  Right Ventricle: Normal right ventricular size, wall thickness, and   systolic function.  Left Atrium: The left atrium is normal in size and structure.  Right Atrium: The right atrium is normal in size and  structure.  Pericardium: There is no evidence of pericardial effusion.  Mitral Valve: The mitral valve is normal in structure. No evidence of     mitral valve stenosis. Mild mitral valve regurgitation is seen.  Tricuspid Valve: The tricuspid valve is normal. Mild tricuspid   regurgitation is visualized. The tricuspid regurgitant velocity is 2.06   m/s, and with an assumed right atrial pressure of 5 mmHg, the estimated   right ventricular systolic pressure is normal at 22.0 mmHg.  Aortic Valve: The aortic valve is tricuspid. Mild aortic valve sclerosis   is present, with no evidence of aortic valve stenosis. No evidence of   aortic valve regurgitation is seen.  Pulmonic Valve: The pulmonic valve is not well seen.  Venous: The inferior vena cava was not well visualized.    [id number] Kathlyn Sacramento MD  Electronically signed by [id number] Kathlyn Sacramento MD  Signature Date/Time: 04/15/2014/6:33:42 PM  *** Final ***    IMPRESSION: .        Verified By: Mertie Clause. Fletcher Anon, M.D., MD  Routine Chem:    29-May-15 28:41, Basic Metabolic Panel (w/Total Calcium)  Glucose, Serum 88  BUN 20  Creatinine (comp) 0.80  Sodium, Serum 128  Potassium, Serum 3.9  Chloride, Serum 93  CO2, Serum 27  Calcium (Total), Serum 8.3  Anion Gap 8  Osmolality (calc) 259  eGFR (African American) >60  eGFR (Non-African American) >60  eGFR values <12m/min/1.73 m2 may be an indication of chronic  kidney disease (CKD).  Calculated eGFR is useful in patients with stable renal function.  The eGFR calculation will not be reliable in acutely ill patients  when serum creatinine is changing rapidly. It is not useful in   patients on dialysis. The eGFR calculation may not be applicable  to patients at the low and high extremes of body sizes, pregnant  women, and vegetarians.    30-May-15 032:44 Basic Metabolic Panel (w/Total Calcium)  Glucose, Serum 67  BUN 17  Creatinine (comp) 0.69  Sodium, Serum 133  Potassium, Serum  3.5  Chloride, Serum 102  CO2, Serum 26  Calcium (Total), Serum 7.6  Anion Gap 5  Osmolality (calc) 266  eGFR (African American) >60  eGFR (Non-African American) >60  eGFR values <669mmin/1.73 m2 may be an indication of chronic  kidney disease (CKD).  Calculated eGFR is useful in patients with stable renal function.  The eGFR calculation will not be reliable in acutely ill patients  when serum creatinine is changing rapidly. It is not useful in   patients on dialysis. The eGFR calculation may not be applicable  to patients at the low and high extremes of body sizes, pregnant  women, and vegetarians.    01-Jun-15 05:08, Activated PTT  Result Comment   APTT - RESULTS VERIFIED BY REPEAT TESTING.   - NOTIFIED OF CRITICAL VALUE   - C/MELISSA COBB AT 060102/1/15.PMH   - READ-BACK PROCESS PERFORMED.   Result(s) reported on 17 Apr 2014 at 06:42AM.    01-Jun-15 29:51, Basic Metabolic Panel (w/Total Calcium)  Glucose, Serum 82  BUN 13  Creatinine (comp) 0.79  Sodium, Serum 137  Potassium, Serum 3.2  Chloride, Serum 107  CO2, Serum 24  Calcium (Total), Serum 7.5  Anion Gap 6  Osmolality (calc) 273  eGFR (African American) >60  eGFR (Non-African American) >60  eGFR values <71m/min/1.73 m2 may be an indication of chronic  kidney disease (CKD).  Calculated eGFR is useful in patients with stable renal function.  The eGFR calculation will not be reliable in acutely ill patients  when serum creatinine is changing rapidly. It is not useful in   patients on dialysis. The eGFR calculation may not be applicable  to patients at the low and high extremes of body sizes, pregnant  women, and vegetarians.    02-Jun-15 088:41 Basic Metabolic Panel (w/Total Calcium)  Glucose, Serum 81  BUN 11  Creatinine (comp) 0.80  Sodium, Serum 138  Potassium, Serum 3.9  Chloride, Serum 108  CO2, Serum 23  Calcium (Total), Serum 7.5  Anion Gap 7  Osmolality (calc) 274  eGFR (African American) >60   eGFR (Non-African American) >60  eGFR values <644mmin/1.73 m2 may be an indication of chronic  kidney disease (CKD).  Calculated eGFR is useful in patients with stable renal function.  The eGFR calculation will not be reliable in acutely ill patients  when serum creatinine is changing rapidly. It is not useful in   patients on dialysis. The eGFR calculation may not be applicable  to patients at the low and high extremes of body sizes, pregnant  women, and vegetarians.  Routine UA:    29-May-15 20:41, Urinalysis  Color (UA) Yellow  Clarity (UA) Hazy  Glucose (UA) Negative  Bilirubin (UA) Negative  Ketones (UA) Negative  Specific Gravity (UA) 1.013  Blood (UA) 1+  pH (UA) 6.0  Protein (UA) Negative  Nitrite (UA) Negative  Leukocyte Esterase (UA) 1+  Result(s) reported on 14 Apr 2014 at 10:31PM.  RBC (UA) 3 /HPF  WBC (UA) 12 /HPF  Bacteria (UA) 1+  Epithelial Cells (UA) 1 /HPF  Result(s) reported on 14 Apr 2014 at 10:31PM.  Routine Coag:    29-May-15 15:57, Activated PTT  Activated PTT (APTT) 34.9  A HCT value >55% may artifactually increase the APTT. In one study,  the increase was an average of 19%.  Reference: "Effect on Routine and Special Coagulation Testing Values  of Citrate Anticoagulant Adjustment in Patients with High HCT Values."  American Journal of Clinical Pathology 2006;126:400-405.    29-May-15 15:57, Prothrombin Time  Prothrombin 13.5  INR 1.0  INR reference interval applies to patients on anticoagulant therapy.  A single INR therapeutic range for coumarins is not optimal for all  indications; however, the suggested range for most indications is  2.0 - 3.0.  Exceptions to the INR Reference Range may include: Prosthetic heart  valves, acute myocardial infarction, prevention of myocardial  infarction, and combinations of aspirin and anticoagulant. The need  for a higher or lower target INR must be assessed individually.  Reference: The Pharmacology and  Management of the Vitamin K   antagonists: the seventh ACCP Conference on Antithrombotic and  Thrombolytic Therapy. ChYSAYT.0160ept:126 (3suppl): [id number] A HCT value >55% may artifactually increase the PT.  In one study,   the increase was an average of 25%.  Reference:  "Effect on Routine and Special Coagulation Testing Values  of Citrate Anticoagulant Adjustment in Patients with  High HCT Values."  American Journal of Clinical Pathology 2006;126:400-405.    29-May-15 23:18, Activated PTT  Activated PTT (APTT) 68.2  A HCT value >55% may artifactually increase the APTT. In one study,  the increase was an average of 19%.  Reference: "Effect on Routine and Special Coagulation Testing Values  of Citrate Anticoagulant Adjustment in Patients with High HCT Values."  American Journal of Clinical Pathology 2006;126:400-405.    30-May-15 06:03, Activated PTT  Activated PTT (APTT) 36.1  A HCT value >55% may artifactually increase the APTT. In one study,  the increase was an average of 19%.  Reference: "Effect on Routine and Special Coagulation Testing Values  of Citrate Anticoagulant Adjustment in Patients with High HCT Values."  American Journal of Clinical Pathology 2006;126:400-405.    30-May-15 13:45, Activated PTT  Activated PTT (APTT) 56.3  A HCT value >55% may artifactually increase the APTT. In one study,  the increase was an average of 19%.  Reference: "Effect on Routine and Special Coagulation Testing Values  of Citrate Anticoagulant Adjustment in Patients with High HCT Values."  American Journal of Clinical Pathology 2006;126:400-405.    30-May-15 20:23, Activated PTT  Activated PTT (APTT) 51.9  A HCT value >55% may artifactually increase the APTT. In one study,  the increase was an average of 19%.  Reference: "Effect on Routine and Special Coagulation Testing Values  of Citrate Anticoagulant Adjustment in Patients with High HCT Values."  American Journal of Clinical Pathology  2006;126:400-405.    31-May-15 04:10, Activated PTT  Activated PTT (APTT) 59.0  A HCT value >55% may artifactually increase the APTT. In one study,  the increase was an average of 19%.  Reference: "Effect on Routine and Special Coagulation Testing Values  of Citrate Anticoagulant Adjustment in Patients with High HCT Values."  American Journal of Clinical Pathology 2006;126:400-405.    31-May-15 11:01, Activated PTT  Activated PTT (APTT) 80.7  A HCT value >55% may artifactually increase the APTT. In one study,  the increase was an average of 19%.  Reference: "Effect on Routine and Special Coagulation Testing Values  of Citrate Anticoagulant Adjustment in Patients with High HCT Values."  American Journal of Clinical Pathology 2006;126:400-405.    01-Jun-15 05:08, Activated PTT  Activated PTT (APTT) > 160.0  A HCT value >55% may artifactually increase the APTT. In one study,  the increase was an average of 19%.  Reference: "Effect on Routine and Special Coagulation Testing Values  of Citrate Anticoagulant Adjustment in Patients with High HCT Values."  American Journal of Clinical Pathology 2006;126:400-405.    01-Jun-15 14:31, Activated PTT  Activated PTT (APTT) 86.0  A HCT value >55% may artifactually increase the APTT. In one study,  the increase was an average of 19%.  Reference: "Effect on Routine and Special Coagulation Testing Values  of Citrate Anticoagulant Adjustment in Patients with High HCT Values."  American Journal of Clinical Pathology 2006;126:400-405.  Routine Hem:    29-May-15 15:58, CBC Profile  WBC (CBC) 14.5  RBC (CBC) 3.89  Hemoglobin (CBC) 10.0  Hematocrit (CBC) 31.9  Platelet Count (CBC) 527  MCV 82  MCH 25.6  MCHC 31.3  RDW 20.9  Neutrophil % 92.9  Lymphocyte % 3.1  Monocyte % 3.9  Eosinophil % 0.0  Basophil % 0.1  Neutrophil # 13.4  Lymphocyte # 0.5  Monocyte # 0.6  Eosinophil # 0.0  Basophil # 0.0  Result(s) reported on 14 Apr 2014 at 04:30PM.     30-May-15 06:03, CBC Profile  WBC (CBC) 10.3  RBC (CBC) 3.41  Hemoglobin (CBC) 8.9  Hematocrit (CBC) 28.1  Platelet Count (CBC) 465  MCV 83  MCH 26.1  MCHC 31.6  RDW 20.7  Neutrophil % 83.2  Lymphocyte % 10.4  Monocyte % 6.2  Eosinophil % 0.0  Basophil % 0.2  Neutrophil # 8.6  Lymphocyte # 1.1  Monocyte # 0.6  Eosinophil # 0.0  Basophil # 0.0  Result(s) reported on 15 Apr 2014 at 06:35AM.    01-Jun-15 05:08, CBC Profile  WBC (CBC) 9.3  RBC (CBC) 3.09  Hemoglobin (CBC) 8.1  Hematocrit (CBC) 25.5  Platelet Count (CBC) 448  MCV 82  MCH 26.3  MCHC 31.9  RDW 21.0  Neutrophil % 81.4  Lymphocyte % 11.1  Monocyte % 7.0  Eosinophil % 0.0  Basophil % 0.5  Neutrophil # 7.5  Lymphocyte # 1.0  Monocyte # 0.6  Eosinophil # 0.0  Basophil # 0.0  Result(s) reported on 17 Apr 2014 at 06:04AM.    04-Jun-15 05:19, Hemoglobin  Hemoglobin (CBC) 8.3  Result(s) reported on 20 Apr 2014 at 05:57AM.   RADIOLOGY:  Radiology Results: XRay:    29-May-15 16:16, Femur Right  Femur Right  REASON FOR EXAM:    R leg pain  COMMENTS:       PROCEDURE: DXR - DXR FEMUR RIGHT  - Apr 14 2014  4:16PM     CLINICAL DATA:  Trauma and pain.    EXAM:  RIGHT KNEE - COMPLETE 4+ VIEW; RIGHT FEMUR - 2 VIEW    COMPARISON:  None.    FINDINGS:  Four views of the right knee demonstrate mild osteopenia. Mild  patellofemoral and medial compartment joint space narrowing. No  acute fracture or dislocation. No joint effusion.    Four views of the right femur.  No acute fracture or dislocation.     IMPRESSION:  No acute findings about the right femur or knee.    Mild 2 compartment knee osteoarthritis.      Electronically Signed    By: Abigail Miyamoto M.D.    On: 04/14/2014 16:27       Verified By: Areta Haber, M.D.,    29-May-15 16:16, Knee Right Complete  Knee Right Complete  REASON FOR EXAM:    R knee pain and swelling  COMMENTS:       PROCEDURE: DXR - DXR KNEE RT COMP WITH OBLIQUES  -  Apr 14 2014  4:16PM     CLINICAL DATA:  Trauma and pain.    EXAM:  RIGHT KNEE - COMPLETE 4+ VIEW; RIGHT FEMUR - 2 VIEW    COMPARISON:  None.    FINDINGS:  Four views of the right knee demonstrate mild osteopenia. Mild  patellofemoral and medial compartment joint space narrowing. No  acute fracture or dislocation. No joint effusion.    Four views of the right femur.  No acute fracture or dislocation.     IMPRESSION:  No acute findings about the right femur or knee.    Mild 2 compartment knee osteoarthritis.      Electronically Signed    By: Abigail Miyamoto M.D.    On: 04/14/2014 16:27       Verified By: Areta Haber, M.D.,  [place]:    29-May-15 16:54, [place] Color Flow Doppler Lower Extrem Right (Leg)  [place] Color Flow Doppler Lower Extrem Right (Leg)  REASON FOR EXAM:    R leg swelling r/o DVT  COMMENTS:  PROCEDURE: [place]  - US DOPPLER LOW EXTR RIGHT  - Apr 14 2014  4:54PM     CLINICAL DATA:  Right leg pain and swelling    EXAM:  RIGHT LOWER EXTREMITY VENOUS DOPPLER ULTRASOUND    TECHNIQUE:  Gray-scale sonography with graded compression, as well as color  Doppler and duplex ultrasound were performed to evaluate the lower  extremity deep venous systems from the level of the common femoral  vein and including the common femoral, femoral, profunda femoral,  popliteal and calf veins including the posterior tibial, peroneal  and gastrocnemius veins when visible. The superficial great  saphenous vein was also interrogated. Spectral Doppler was utilized  to evaluate flow at rest and with distalaugmentation maneuvers in  the common femoral, femoral and popliteal veins.    COMPARISON:  None.    FINDINGS:  Common Femoral Vein: Positive for intraluminal echogenic thrombus,  nearly occlusive    Saphenofemoral Junction: Femoral thrombus noted atthe saphenous  femoral junction.  Profunda Femoral Vein: Positive for intraluminal thrombus.  Noncompressible. Clot appears  occlusive.    Femoral Vein: Diffuse echogenic intraluminal thrombus. Clot appears  occlusive. Vessels are noncompressible.    Popliteal Vein: Positive for intraluminal echogenic thrombus  appearing occlusive. Vessels are noncompressible.    Calf Veins: Echogenic occlusive thrombus extended to the calf veins  well.    Superficial Great Saphenous Vein: No evidence of thrombus.Normal  compressibility and flow on color Doppler imaging.  Venous Reflux:  None.    Other Findings:  None.     IMPRESSION:  Positive exam for occlusive right femoral popliteal DVT, as above.    These results will be called to the ordering clinician or  representative by the Radiology Department at the imaging location.      Electronically Signed    By: Daryll Brod M.D.    On: 04/14/2014 17:02     Verified By: Earl Gala, M.D.,  [place]:    29-May-15 16:16, Femur Right  PACS Image    29-May-15 16:16, Knee Right Complete  PACS Image    29-May-15 16:54, [place] Color Flow Doppler Lower Extrem Right (Leg)  PACS Image    29-May-15 17:57, CT Angiography Chest/Abd/Pelvis w/wo  PACS Image  CT:  CT Angiography Chest/Abd/Pelvis w/wo  REASON FOR EXAM:    sob, hypotension. hx of metatatic colon cancer. r/o   dissection vs PE  COMMENTS:       PROCEDURE: CT  - CT ANGIOGRAPHY CHEST/ABD/PELVIS  - Apr 14 2014  5:57PM     CLINICAL DATA:  59 year old female with right lower extremity  weakness pain and swelling. Shortness of breath, hypotension.  Dissection or pulmonary embolus. Initial encounter. History of  metastatic colon cancer. Right lower extremity DVT detected on  ultrasound today.    EXAM:  CT ANGIOGRAPHY CHEST, ABDOMEN AND PELVIS  TECHNIQUE:  Multidetector CT imaging through the chest, abdomen and pelvis was  performed using the standard protocol during bolus administration of  intravenous contrast. Multiplanar reconstructed images and MIPs were  obtained and reviewed toevaluate the vascular  anatomy.    CONTRAST:  100 mL Isovue 370.    COMPARISON:  Right lower extremity ultrasound 1636 hr the same day.    FINDINGS:  CTA CHEST FINDINGS    Major airways are patent. Occasional mild linear scarring or  atelectasis in both lungs. No pneumothorax, pleural effusion or  consolidation. There is a small cluster of 3 mm pulmonary nodules in  the periphery of the right midlung (  series 5 images 20 and 21).  Similar cluster seen in the right upper lobe on image 12. No  pulmonary nodule identified on the left.    No destructive osseous lesion identified in the chest.    Multiple bilateral thyroid nodules, the largest about 12 mm,  significance doubtful in this clinical setting.    Bulky right peritracheal and right thoracic inlet lymphadenopathy,  individual nodes or nodal conglomeration measuring up to 17 mm short  axis. Bulky right anterior carina nodal conglomeration encompassing  26 x 31 mm. Confluent nodal disease continues into the subcarinal  station. No hilar lymphadenopathy. Smaller left thoracic inlet/  prevascular nodes measuring up to 11 mm short axis individually. No  pericardial effusion. No axillary lymphadenopathy.    VASCULAR FINDINGS:    Mild atherosclerosis of the aorta and proximal great vesselswhich  remain patent. No thoracic aortic dissection. Ascending aorta  fusiform enlargement up to 40 mm diameter.    Positive right lower lobe pulmonary emboli (series 6, image 90).  More distal occlusive emboli in the posterior basal segment of the  right lower lobe. Right lateral basal segment involvement also  suspected. Right upper lobe pulmonary arteries are spared. No saddle  or central embolus. Small volume clot at the left lower lobe  segmental pulmonary artery branching (image 72). Left upper lobe  pulmonary arteries are spared.    RV/LV ratio 0.95. Mild straightening of the interventricular septum  (series 6, image 107).    Review of the MIP images  confirms the above findings.    CTA ABDOMEN AND PELVIS FINDINGS    No destructive osseous lesion identified in the lumbar spine or  pelvis.    Moderate to severe body wall edema greater on the right. Diffuse  subcutaneous stranding. This has an appearance suggesting anasarca,  but is asymmetrically involving the right abdominal wall and flank  to a greater degree.    No pelvic free fluid. Stool in the rectum. In the sigmoid colon  there is a thick walled fungating soft tissue mass nearly  obliterating the lumen of the proximal sigmoid. This lesion  encompasses 53 by 139 x 47 mm (APby transverse by CC). See coronal  image 56 and series 6, image 264. The lesion is inseparable from the  bladder fundus. The lesion is inseparable from distal small bowel  loops, and more proximal small bowel loops in the abdomen are mildly  to moderately dilated up to the 35 mm diameter. There is low-density  stool throughout the more proximal colon which also is mildly  dilated. There is bulky retroperitoneal lymphadenopathy extending to  the SMA origin. There is small bowel mesenteric adenopathy with  nodes individually measuring up to 23 mm short axis. The left ureter  and kidney are obstructed by the retroperitoneal and sigmoid  disease. The left renal cortex is severely thinned.    The stomach and duodenum are decompressed. There is heterogeneous  enhancement of the liver, with geographic hypoenhancement. There are  small superimposed more discrete low-density lesions in the liver  which are small, the largest is 2 cm in the inferior right hepatic  lobe (series 6, image 167).    Negative gallbladder, spleen, pancreas (atrophied), adrenal glands,  right kidney, and right ureter.    VASCULAR FINDINGS:    The abdominal aorta and its major branches are patent. Major pelvic  arterial structures are patent with mild atherosclerosis. There does  appear to be preserved enhancement of the right pelvic  veins and  IVC. The IVC is flattened in the upper abdomen, perhaps in part due  to the retroperitoneal metastases. No abdominal aortic dissection.    Review of the MIP images confirms the above findings.     IMPRESSION:  1. Positive for acute pulmonary embolus. Positive for acute PE with  CTevidence of right heart strain (RV/LV Ratio = 0.95) consistent  with at least submassive (intermediate risk) PE. The presence of  right heart strain has been associated with an increased risk of  morbidity and mortality. Critical Value/emergent results were called  by telephone at the time of interpretation on 04/14/2014 at 6:12 PM  to Dr. Shirlyn Goltz , who verbally acknowledged these results.  2. Bulky and fungating sigmoid colon tumor with secondary  involvement of distal small bowel loops, the left ureter, and the  dome of the bladder.  3. Subsequent small bowel obstruction, appears moderate. Subsequent  severe and subacute to chronic appearing left renal an ureter  obstruction (with left renal atrophy).  4. Bulky mediastinal, retroperitoneal, and thoracic inlet metastatic  nodal disease.  5. Abnormal enhancement of the liver, might in part reflect  congestive hepatopathy. Several 2 cm or smaller hypodense liver  lesions, indeterminate for liver metastases.  6. No abdominal aortic dissection or aneurysm. There is fusiform  aneurysmal enlargement of the ascending aorta up to 40 mm diameter.      Electronically Signed    By: Lars Pinks M.D.    On: 04/14/2014 18:19         Verified By: Gwenyth Bender. HALL, M.D.,   ASSESSMENT AND PLAN:  Assessment/Admission Diagnosis PE gi bleed on xarelto metastatic colon caner   Plan I told her that if she desires any invasive procedures and care, a filter was reasonable.  Given her late stage colon cancer, comfort measures alone would be reasonable.  She is not tolerating anticoagulation and this will need to stop.  Risk of recurrent PE high without  anticoagulation.  She desires a filter be placed and we will arrange this for tomorrow.    level 4   Electronic Signatures: Algernon Huxley (MD)  (Signed 04-Jun-15 16:08)  Authored: Chief Complaint and History, PAST MEDICAL/SURGICAL HISTORY, ALLERGIES, HOME MEDICATIONS, Family and Social History, Review of Systems, Physical Exam, LABS, RADIOLOGY, Assessment and Plan   Last Updated: 04-Jun-15 16:08 by Algernon Huxley (MD)

## 2015-03-10 NOTE — Consult Note (Signed)
Brief Consult Note: Diagnosis: rectal bleeding.   Patient was seen by consultant.   Comments: Ashley French is a very pleasant unfortunate 59 y/o black female with stage IV colon cancer, DVT & rectal bleeding.  She has never had a colonoscopy & tells me metastatic cancer was diagnosed by CT scan.  She was on hospice prior to admission.  She is s/p IVC filter & is receiving blood transfusion.  I explained & offered colonoscopy with Dr Servando SnareWohl for possible further bleeding intervention if tumor is amenable to this, risk/benefits/alternatives,  & pt & daughter have decided they do not want to pursue this at this time. Patient wants to have hospice services at this time & palliative care is involved with her management here.  Please call if pt changes her mind.  Could also consider interventional radiology for arterial embolization to that area for bleeding control as well. Pt understands the gravity of her condition & all questions answered.  All parties in agreement.  I have discussed her care with Dr Midge Miniumarren Wohl.  I will sign off.  Thanks for allowing us to participate in her care.  Please see full dictated note. #[id number].  Electronic Signatures: Joselyn ArrowJones, Kashawna Manzer L (NP)  (Signed 05-Jun-15 16:30)  Authored: Brief Consult Note   Last Updated: 05-Jun-15 16:30 by Joselyn ArrowJones, Deshawn Skelley L (NP)

## 2015-03-10 NOTE — H&P (Signed)
PATIENT NAME:  Ashley French MR#:  [id number] DATE OF BIRTH:  May 30, 1956  DATE OF ADMISSION:  04/14/2014  ADMITTING PHYSICIAN: Enid Baasadhika Thurley Francesconi, MD.   [place] [facility]   CHIEF COMPLAINT: Right leg pain.   HISTORY OF PRESENT ILLNESS: Ashley French is French 59 year old African American female with past medical history significant for metastatic cancer diagnosed about 3 months ago [facility], likely colon cancer. Was discharged home with hospice because it was inoperable and she was given an extremely poor prognosis. The patient states she was still walking at home but over the last couple of months noted that she has been having some right thigh and knee pain.  She still was ambulating with rehab. For over the last couple of weeks, her pain and swelling have become much worse to the point that she is not able to get out of bed and her daughter is not able to pick her up, so presented to the ER. [facility] the patient. The patient was noted to be hypotensive and tachycardic here and her ultrasound showed occlusive right femoropopliteal DVT.  She also complained of some dyspnea going on since around the same time. No chest pain and CT chest was ordered which showed the patient has acute pulmonary embolus.  So she is being admitted for symptomatic DVT and PE.   PAST MEDICAL HISTORY:  Metastatic colon cancer with widespread metastatic disease, inoperable and poor prognosis.  PAST SURGICAL HISTORY:  None.  ALLERGIES:  No known drug allergies.  CURRENT HOME MEDICATIONS: 1.  Decadron 4 mg p.o. b.i.d.  2.  Haldol 0.5 mg 4 times French day as needed.  3.  Lactulose 30 mL once French day.  4.  Morphine 30 mg p.o. b.i.d.  5.  Nystatin oral suspension 5 mL 4 times French day.  6.  Oxycodone 5 mg q. 2 to 3 hours p.r.n. for pain.  7.  Ranitidine 150 mg p.o. b.i.d.  8.  Senokot 2 tablets once French day at bedtime.   SOCIAL HISTORY:  Living with daughter. No known smoking or alcohol use. Has French walker and cane at home.  FAMILY HISTORY: Both parents have dementia.  REVIEW OF SYSTEMS:  CONSTITUTIONAL: No fever. Positive for fatigue and weight loss.  EYES: Positive for blurry vision.  No inflammation, glaucoma, cataracts.  ENT: No tinnitus, ear pain, hearing loss, epistaxis or discharge. Complains of extremely dry mouth.  RESPIRATORY: No cough, wheeze, hemoptysis or COPD.  CARDIOVASCULAR: No chest pain, orthopnea or edema.  Positive for dyspnea on exertion. No palpitations. GASTROINTESTINAL: Positive for nausea. No vomiting, diarrhea.  Positive for abdominal pain. No hematemesis or melena.  GENITOURINARY: No dysuria, hematuria, renal calculus, frequency, incontinence.  ENDOCRINE: No polyuria, nocturia, thyroid problems, heat or cold intolerance.  HEMATOLOGY: No anemia, easy bruising or bleeding.  SKIN: No acne, rash or lesions.  MUSCULOSKELETAL: Positive for right leg pain.  NEUROLOGIC: No numbness, weakness, CVA, TIA or seizures.  PSYCHOLOGICAL: No anxiety, insomnia, depression.   PHYSICAL EXAMINATION: VITAL SIGNS: Temperature 97.9 degrees Fahrenheit, pulse 100, respirations 18, blood pressure 85/62, pulse ox 100% on room air.  GENERAL: Well-built, ill-nourished cachectic female sitting in bed, not in any acute distress.  HEENT: Normocephalic, atraumatic. Pupils equal, round, reacting to light. Anicteric sclerae. Extraocular movements intact. Oropharynx clear without erythema, mass or exudates. Dry mucous membranes noted.  LUNGS: Moving air bilaterally. No wheeze or crackles. No use of accessory muscles  for breathing.  CARDIOVASCULAR: S1, S2, regular rate and rhythm. No murmurs, rubs or gallops.  ABDOMEN: Soft, distended, nontender, though normal bowel sounds are present. No hepatosplenomegaly.  EXTREMITIES: No pedal edema. However, there is tenderness and swelling of her right thigh with limited movement secondary to the  pain. No clubbing or cyanosis.  SKIN: No acne, rash or lesions.  LYMPHATICS: No cervical lymphadenopathy.  NEUROLOGIC: Cranial nerves are intact. No focal motor or sensory deficits.  PSYCHOLOGICAL: The patient is awake, alert, oriented x 3.   LABORATORY DATA: WBC 14.5, hemoglobin 10.0, hematocrit 31.9, platelet count 527.  Sodium 128, potassium 3.9, chloride 93, bicarbonate 27, BUN 20, creatinine 0.8, glucose 88 and calcium of 8.3, INR 1.0. PTT is 34.9.   Right femur x-ray showing no acute changes around the knee, osteoarthritis present.    Knee x-ray showing no acute changes, knee osteoarthritis noted.   Ultrasound Doppler of the leg showing occlusive right femoral popliteal DVT.    CT angio of the chest, abdomen and pelvis showing large acute PE with evidence of right heart strain, submassive PE likely. Increased risk of morbidity and mortality noted. Bulky fungating sigmoid colon tumor with involvement of distal small bowel loops, left ureter, dome of bladder, small bowel obstruction. Bulky mediastinal, retroperitoneal and thoracic inlet metastatic nodal disease.  Congestive hepatopathy and small liver nodules consistent with liver metastatic disease also noted.  ASSESSMENT AND PLAN: French 59 year old female with metastatic colon cancer, poor prognosis, followed by [facility] comes with deep vein thrombosis and pulmonary embolus.  1.  Extensive right leg deep vein thrombosis and submassive pulmonary embolus with right heart strain, started on IV heparin and she has had not been hypotensive.  No DO NOT RESUSCITATE status seen. Discussed with patient and wants to think about it at this time. CT showing extensively spread colon cancer and poor prognosis. We will get palliative care consult who could benefit from comfort care hospice home, pain management and IV fluids at this time.  2.  Hyponatremia and hypochloremia likely from dehydration, IV fluids. Extensive colon cancer, poor prognosis,  hospice recommended.  TOTAL TIME SPENT:  45 minutes    ____________________________ Enid Baas, MD rk:ce D: 04/14/2014 19:10:50 ET T: 04/14/2014 21:34:23 ET JOB#: [id number]  cc: Enid Baas, MD, <Dictator> Enid Baas MD ELECTRONICALLY SIGNED 04/29/2014 15:47

## 2015-03-10 NOTE — Consult Note (Signed)
PATIENT NAME:  Shaneyfelt, [place] A MR#:  [id number] DATE OF BIRTH:  05-Jun-1956  DATE OF CONSULTATION:  04/21/2014  REFERRING PHYSICIAN:  Dr. Posey Pronto CONSULTING PHYSICIAN:  Lucilla Lame, MD / Andria Meuse, NP  PRIMARY CARE PHYSICIAN: [facility]  REASON FOR CONSULTATION: Rectal bleeding,   HISTORY OF PRESENT ILLNESS: Ms. Bickle is a 59 year old African American female who was diagnosed with stage IV metastatic colon cancer 3 months ago at [facility]. She was discharged home to hospice and was told that her cancer was inoperable. She is currently staying at her daughter's house in town. She became hypotensive and tachycardic and presented here and was found to have an occlusive right femoral popliteal DVT. She then had a chest CT and was found to have a pulmonary embolus. She was started on heparin and yesterday nursing staff noticed blood in her stool. She tells me she has never had a colonoscopy. She is currently not in pain. She denies any nausea or vomiting. She does have 5/10 abdominal pain. She has had a few loose stools and had a C. diff that was negative. She has had an IVC filter placed and heparin was stopped.   PAST MEDICAL AND SURGICAL HISTORY: Stage IV colon cancer.   MEDICATIONS PRIOR TO ADMISSION: Decadron 4 mg p.o. b.i.d., Haldol 0.5 mg q.i.d. p.r.n., lactulose 30 mg daily, morphine 30 mg b.i.d., nystatin oral suspension 5 mL q.i.d., oxycodone 5 mg q. 2 to 3 hours p.r.n. pain, ranitidine 150 mg b.i.d., Senokot 2 tablets once at bedtime.   ALLERGIES: No known drug allergies.   FAMILY HISTORY: Noncontributory.   SOCIAL HISTORY: She is currently residing with her daughter. There is no tobacco, alcohol or illicit drug use. She previously did childcare work. She has 2 children who are both healthy, a son and a daughter.   REVIEW OF SYSTEMS: See HPI. CONSTITUTIONAL: Fatigue and weakness.  EYES: Blurry vision.  RESPIRATORY: Shortness of breath on  exertion.  CARDIOVASCULAR: Palpitations and tachycardia.  EXTREMITIES: She is having edema and right lower extremity pain. Otherwise, negative 12 point review of systems.  PHYSICAL EXAMINATION:  VITAL SIGNS: Temperature 98.5, pulse 129, respirations 18, blood pressure 85/62, oxygen saturation 99% on room air.  GENERAL: She is a chronically ill-appearing black female who is alert, oriented, very pleasant and cooperative. She is accompanied by her daughter today.  HEENT: Sclerae clear, anicteric. Conjunctivae pink. Oropharynx pink and dry. NECK: Supple without any mass or thyromegaly.  CHEST: Mild tachycardia without murmurs, clicks, rubs, or gallops.  LUNGS: Decreased breath sounds bilaterally. No acute distress.  ABDOMEN: Protuberant. She has palpable fullness and mass throughout entire abdomen, left greater than right and moderate distention. There is no rebound tenderness or guarding.  EXTREMITIES: She has right pretibial trace edema. No clubbing or cyanosis.  SKIN: Pale, warm and dry.  NEUROLOGIC: Grossly intact.  PSYCHIATRIC: Alert, cooperative, depressed mood and flat affect.   DIAGNOSTIC DATA: Hemoglobin 6.6 down from 8.3 yesterday and 10 on admission. Her platelet count is 426,000. Her white blood cell count 11.3. Creatinine 0.51, chloride 109, and calcium 7.6, otherwise normal met-7. INR 1. C. diff negative. Urinalysis showed 4000 CFU gram-positive cocci.   IMPRESSION: Ms. Giannotti is a very pleasant 59 year old black female with stage IV colon cancer, deep vein thrombosis, and rectal bleeding. She has never had a colonoscopy and tells me metastatic cancer was diagnosed by CT scan. She was on hospice prior to admission in [place]. She is status post  IVC filter and is receiving blood transfusion currently. I explained and offered colonoscopy with Dr. Allen Norris for possible bleeding intervention if tumor is amenable to this, risks, benefits, and alternatives, and the patient and daughter have  decided they do not want to pursue this at this time. The patient wants to have hospice services at this time and palliative care is involved with her management here. Please call if she changes her mind. Could also consider interventional radiology for arterial embolization to that area for bleeding control as well. The patient understands the gravity of her condition and all questions have been answered. All parties are in agreement. I will sign off. Thank you for allowing [place] to participate in her care.   This services provided by Andria Meuse, NP under collaborative agreement with Dr. Lucilla Lame.   ____________________________ Andria Meuse, NP klj:sb D: 04/21/2014 16:22:16 ET T: 04/21/2014 16:37:10 ET JOB#: [id number]  cc: Andria Meuse, NP, <Dictator> Andria Meuse FNP ELECTRONICALLY SIGNED 05/19/2014 16:40

## 2015-03-10 NOTE — Discharge Summary (Signed)
PATIENT NAME:  Ashley French, Ashley French MR#:  [id number] DATE OF BIRTH:  26-Mar-1956  DATE OF ADMISSION:  04/14/2014 DATE OF DISCHARGE:  04/27/2014  Please refer to the discharge summary dictated by Dr. Elpidio AnisSudini on 04/24/2014. His discharge summary covers the date of admission on May 29 until June 08.  My discharge summary  covers the day of, technically, the 9th, 10th and 11th.  DISPOSITION:   Skilled nursing facility.   MEDICATIONS AT DISCHARGE:   1.  Lactulose 30 mL once French day.  2.  Morphine 30 mg twice daily.  3.  Nystatin 5 mL orally 4 times French day.  4.  Senna 2 tabs at bedtime.  5.  Dexamethasone 4 mg take 1 tablet 2 times French day.  6.  Haloperidol 0.5 mg 4 times French day.  7.  Oxycodone 5 mg 2 tabs  every 4 hours.  8.  Morphine 10 mg 2 times daily.  9.  Ativan 0.5 mg 4 times daily as needed for anxiety.  10.  Ferrous sulfate 325 mg 3 times French day.   Follow up with primary care physician as needed.   HOSPITAL COURSE: Again, please look at Dr. Eddie NorthSudini's discharge summary from the 8th. The patient is French [age] year old [place] female with history of metastatic cancer diagnosed about 3 months ago, located on the colon as primary.  Discharged home on hospice. The patient had significant decline, very poor prognosis. The patient does not want any significant treatments. She wants quality of life. For her, her quality of life is at least get up, move around and walk, for what we are trying to discharge her to [facility] facility.   The patient has history of DVT and PE, for what she got placed on Xarelto. Unfortunately after that happened, the patient had significant bleeding. She got transfused 2 units on 04/21/2014. She was evaluated by GI.  GI evaluated that she is not French good candidate for endoscopy and treatment due to her medical condition.  The bleeding has stopped after she has been off Xarelto and this is likely secondary to bleeding from the cancer in the colon.  She is French high risk of keep  having bleeding. We have monitored the patient clinically. No more melena.   The patient has hypovolemic shock with hypotension and blood loss that resolved after her transfusion. She had acute blood loss anemia, for what she is going to be taking iron.    As far as her disposition, she is going to be hospice. She is going to be discharged to skilled nursing facility with hospice care in order to provide some physical therapy to allow her to get back home and at least spend the last days of her life being able to ambulate and get around to care for herself.   I spent about 35 minutes discharging this patient today.     ____________________________ Ashley Furnaceoberto Sanchez Gutierrez, MD rsg:dmm D: 04/27/2014 14:55:31 ET T: 04/27/2014 17:47:01 ET JOB#: [id number]  cc: Ashley Furnaceoberto Sanchez Gutierrez, MD, <Dictator> Keaja Reaume Juanda ChanceSANCHEZ GUTIERRE MD ELECTRONICALLY SIGNED 05/10/2014 11:16
